# Patient Record
Sex: Female | Born: 1935 | Race: White | Hispanic: No | Smoking: Never smoker
Health system: Southern US, Community
[De-identification: ages and names within clinical notes are randomized; demographics above are authoritative.]

## PROBLEM LIST (undated history)

## (undated) DIAGNOSIS — C801 Malignant (primary) neoplasm, unspecified: Secondary | ICD-10-CM

## (undated) DIAGNOSIS — F329 Major depressive disorder, single episode, unspecified: Secondary | ICD-10-CM

## (undated) DIAGNOSIS — I6990 Unspecified sequelae of unspecified cerebrovascular disease: Secondary | ICD-10-CM

## (undated) DIAGNOSIS — E119 Type 2 diabetes mellitus without complications: Secondary | ICD-10-CM

## (undated) DIAGNOSIS — I639 Cerebral infarction, unspecified: Secondary | ICD-10-CM

## (undated) DIAGNOSIS — M159 Polyosteoarthritis, unspecified: Secondary | ICD-10-CM

## (undated) DIAGNOSIS — D649 Anemia, unspecified: Secondary | ICD-10-CM

## (undated) DIAGNOSIS — F32A Depression, unspecified: Secondary | ICD-10-CM

## (undated) DIAGNOSIS — I10 Essential (primary) hypertension: Secondary | ICD-10-CM

## (undated) DIAGNOSIS — J449 Chronic obstructive pulmonary disease, unspecified: Secondary | ICD-10-CM

## (undated) DIAGNOSIS — D3A00 Benign carcinoid tumor of unspecified site: Secondary | ICD-10-CM

## (undated) DIAGNOSIS — E785 Hyperlipidemia, unspecified: Secondary | ICD-10-CM

## (undated) HISTORY — DX: Anemia, unspecified: D64.9

## (undated) HISTORY — PX: HEMORRHOID SURGERY: SHX153

## (undated) HISTORY — DX: Depression, unspecified: F32.A

## (undated) HISTORY — DX: Polyosteoarthritis, unspecified: M15.9

## (undated) HISTORY — DX: Benign carcinoid tumor of unspecified site: D3A.00

## (undated) HISTORY — DX: Major depressive disorder, single episode, unspecified: F32.9

## (undated) HISTORY — DX: Essential (primary) hypertension: I10

## (undated) HISTORY — PX: TONSILLECTOMY: SUR1361

## (undated) HISTORY — DX: Unspecified sequelae of unspecified cerebrovascular disease: I69.90

## (undated) HISTORY — DX: Type 2 diabetes mellitus without complications: E11.9

## (undated) HISTORY — DX: Hyperlipidemia, unspecified: E78.5

---

## 2003-03-20 DIAGNOSIS — D3A00 Benign carcinoid tumor of unspecified site: Secondary | ICD-10-CM

## 2003-03-20 HISTORY — PX: COLON RESECTION: SHX5231

## 2003-03-20 HISTORY — DX: Benign carcinoid tumor of unspecified site: D3A.00

## 2011-11-02 ENCOUNTER — Encounter: Payer: Self-pay | Admitting: Internal Medicine

## 2011-11-02 ENCOUNTER — Ambulatory Visit (INDEPENDENT_AMBULATORY_CARE_PROVIDER_SITE_OTHER): Payer: Medicare Other | Admitting: Internal Medicine

## 2011-11-02 VITALS — BP 142/60 | HR 51 | Temp 98.3°F | Ht 65.0 in | Wt 107.0 lb

## 2011-11-02 DIAGNOSIS — I152 Hypertension secondary to endocrine disorders: Secondary | ICD-10-CM | POA: Insufficient documentation

## 2011-11-02 DIAGNOSIS — F32A Depression, unspecified: Secondary | ICD-10-CM

## 2011-11-02 DIAGNOSIS — D3A00 Benign carcinoid tumor of unspecified site: Secondary | ICD-10-CM

## 2011-11-02 DIAGNOSIS — I10 Essential (primary) hypertension: Secondary | ICD-10-CM

## 2011-11-02 DIAGNOSIS — I6990 Unspecified sequelae of unspecified cerebrovascular disease: Secondary | ICD-10-CM | POA: Insufficient documentation

## 2011-11-02 DIAGNOSIS — F329 Major depressive disorder, single episode, unspecified: Secondary | ICD-10-CM

## 2011-11-02 DIAGNOSIS — I6930 Unspecified sequelae of cerebral infarction: Secondary | ICD-10-CM

## 2011-11-02 DIAGNOSIS — F39 Unspecified mood [affective] disorder: Secondary | ICD-10-CM | POA: Insufficient documentation

## 2011-11-02 DIAGNOSIS — D649 Anemia, unspecified: Secondary | ICD-10-CM | POA: Insufficient documentation

## 2011-11-02 DIAGNOSIS — M159 Polyosteoarthritis, unspecified: Secondary | ICD-10-CM | POA: Insufficient documentation

## 2011-11-02 DIAGNOSIS — E785 Hyperlipidemia, unspecified: Secondary | ICD-10-CM

## 2011-11-02 LAB — CBC WITH DIFFERENTIAL/PLATELET
Basophils Relative: 0.6 % (ref 0.0–3.0)
Eosinophils Absolute: 0.1 10*3/uL (ref 0.0–0.7)
Eosinophils Relative: 1.4 % (ref 0.0–5.0)
HCT: 36.8 % (ref 36.0–46.0)
Hemoglobin: 11.9 g/dL — ABNORMAL LOW (ref 12.0–15.0)
Lymphs Abs: 1.9 10*3/uL (ref 0.7–4.0)
MCHC: 32.4 g/dL (ref 30.0–36.0)
MCV: 91.9 fl (ref 78.0–100.0)
Monocytes Absolute: 0.5 10*3/uL (ref 0.1–1.0)
Neutro Abs: 6 10*3/uL (ref 1.4–7.7)
RBC: 4.01 Mil/uL (ref 3.87–5.11)
WBC: 8.6 10*3/uL (ref 4.5–10.5)

## 2011-11-02 LAB — HEPATIC FUNCTION PANEL
Alkaline Phosphatase: 61 U/L (ref 39–117)
Bilirubin, Direct: 0 mg/dL (ref 0.0–0.3)
Total Bilirubin: 0.4 mg/dL (ref 0.3–1.2)
Total Protein: 7.4 g/dL (ref 6.0–8.3)

## 2011-11-02 LAB — LIPID PANEL
LDL Cholesterol: 51 mg/dL (ref 0–99)
Total CHOL/HDL Ratio: 2
Triglycerides: 142 mg/dL (ref 0.0–149.0)

## 2011-11-02 LAB — BASIC METABOLIC PANEL
CO2: 32 mEq/L (ref 19–32)
Chloride: 101 mEq/L (ref 96–112)
Creatinine, Ser: 0.5 mg/dL (ref 0.4–1.2)
Potassium: 3.8 mEq/L (ref 3.5–5.1)

## 2011-11-02 NOTE — Assessment & Plan Note (Signed)
BP Readings from Last 3 Encounters:  11/02/11 142/60   Has been labile but okay now No changes

## 2011-11-02 NOTE — Assessment & Plan Note (Signed)
Rx appropriate due to stroke Doesn't appear that cognitive problems are related to statin

## 2011-11-02 NOTE — Patient Instructions (Signed)
Please get last 3 years records from Dr Romeo Apple

## 2011-11-02 NOTE — Progress Notes (Signed)
Subjective:    Patient ID: Sophia Hill, female    DOB: 1921-05-31, 78 y.o.   MRN: [id number]  HPI Here with son (Marie's husband) and daughter Linton Ham here recently from [place] and is now at [facility]  Has HTN which is long standing She is satisfied with the Rx in general Does have labile BP--they generally accept systolics up to 160-170 and occ spikes higher  High cholesterol for some time On statin for some time No apparent difficulty Had a stroke 3 years ago or so---still can't write normally. Mild mobility problems. Speech is better but not back to her baseline. Walks with rollator  Arthritis in hands Some nodules and deformities but no sig pain  Had depression in the past--after stroke Rx with paroxetine Seems to do better when on this med---BP less labile  Carcinoid tumor found in 2005 Initially in intestine and  removed Then recurred  No symptoms on her monthly regimen Sees Dr Enid Skeens for this sandostatin Rx Has had elevated sugars with the carcinoid  Hiatal hernia but no symptoms from this  Current Outpatient Prescriptions on File Prior to Visit  Medication Sig Dispense Refill  . cloNIDine (CATAPRES) 0.1 MG tablet Take 0.1 mg by mouth 3 (three) times daily.       Marland Kitchen lisinopril (PRINIVIL,ZESTRIL) 10 MG tablet Take 10 mg by mouth 2 (two) times daily.       Marland Kitchen PARoxetine (PAXIL) 10 MG tablet Take 10 mg by mouth every morning.       . simvastatin (ZOCOR) 20 MG tablet Take 20 mg by mouth at bedtime.         Allergies  Allergen Reactions  . Augmentin (Amoxicillin-Pot Clavulanate) Rash    Past Medical History  Diagnosis Date  . Hyperlipidemia   . Hypertension   . Carcinoid tumor 2005    [facility]  . Depression   . Late effects of CVA (cerebrovascular accident)   . Osteoarthritis, multiple sites   . Anemia     iron deficiency in the past    Past Surgical History  Procedure Date  . Tonsillectomy   . Colon  resection 2005    for carcinoid  . Hemorrhoid surgery     Family History  Problem Relation Age of Onset  . Stroke Mother   . Hypertension Mother   . Cancer Father   . Heart disease Father   . Diabetes Brother     History   Social History  . Marital Status: Widowed    Spouse Name: N/A    Number of Children: 2  . Years of Education: N/A   Occupational History  . Retired-- [employer]    Social History Main Topics  . Smoking status: Never Smoker   . Smokeless tobacco: Never Used  . Alcohol Use: No  . Drug Use: No  . Sexually Active: Not on file   Other Topics Concern  . Not on file   Social History Narrative   Has living willSon is health care POARequests DNR--done 8/16/13No tube feeds if cognitively unaware   Review of Systems  Constitutional: Positive for unexpected weight change. Negative for fatigue.       Slow steady weight decline  HENT: Negative for hearing loss, congestion, rhinorrhea and dental problem.        Teeth are okay   Eyes:       Cataracts and some vision loss Surgery canceled due to high BP  Respiratory: Negative for cough, chest  tightness and shortness of breath.   Cardiovascular: Negative for chest pain, palpitations and leg swelling.  Gastrointestinal: Negative for constipation and blood in stool.       No heartburn pain  Genitourinary: Negative for dysuria.       Occ urge and stress incontinence  Skin: Positive for rash.       Rash on feet Not really itchy but can be painful. Better with lotion  Neurological: Positive for weakness. Negative for dizziness, syncope, light-headedness and headaches.       Right leg weakness remains from stroke---PT from [facility] has helped Did have recent fall in shower  Hematological: Negative for adenopathy. Bruises/bleeds easily.  Psychiatric/Behavioral: Negative for disturbed wake/sleep cycle and dysphoric mood. The patient is not nervous/anxious.        More forgetful than in the  past--since stroke       Objective:   Physical Exam  Constitutional: She is oriented to person, place, and time. She appears well-developed and well-nourished. No distress.  HENT:  Mouth/Throat: Oropharynx is clear and moist. No oropharyngeal exudate.  Eyes: Conjunctivae and EOM are normal. Pupils are equal, round, and reactive to light.  Neck: Normal range of motion. No thyromegaly present.  Cardiovascular: Normal rate, regular rhythm and normal heart sounds.  Exam reveals no gallop.   No murmur heard.      Faint distal pulses  Pulmonary/Chest: Effort normal and breath sounds normal. No respiratory distress. She has no wheezes. She has no rales.  Abdominal: Soft. There is no tenderness.  Musculoskeletal: She exhibits no edema and no tenderness.  Lymphadenopathy:    She has no cervical adenopathy.  Neurological: She is alert and oriented to person, place, and time.  Skin: No rash noted. No erythema.       Small contusions on right low back and side ?very early stasis changes on feet  Psychiatric: She has a normal mood and affect. Her behavior is normal.          Assessment & Plan:

## 2011-11-02 NOTE — Assessment & Plan Note (Signed)
Mood issues since the stroke Seems to do better on the med so will continue

## 2011-11-02 NOTE — Assessment & Plan Note (Signed)
Sees Dr Penny Pia--- fax [phone] On regular sandostatin

## 2011-11-02 NOTE — Assessment & Plan Note (Signed)
Mild right leg weakness Small cognitive changes On statin and ASA

## 2012-01-02 DIAGNOSIS — C19 Malignant neoplasm of rectosigmoid junction: Secondary | ICD-10-CM

## 2012-01-02 DIAGNOSIS — I6789 Other cerebrovascular disease: Secondary | ICD-10-CM

## 2012-01-02 DIAGNOSIS — I10 Essential (primary) hypertension: Secondary | ICD-10-CM

## 2012-01-04 ENCOUNTER — Encounter: Payer: Self-pay | Admitting: Internal Medicine

## 2012-01-04 ENCOUNTER — Ambulatory Visit (INDEPENDENT_AMBULATORY_CARE_PROVIDER_SITE_OTHER): Payer: Medicare Other | Admitting: Internal Medicine

## 2012-01-04 VITALS — BP 158/60 | HR 58 | Temp 98.1°F | Wt 111.0 lb

## 2012-01-04 DIAGNOSIS — D3A00 Benign carcinoid tumor of unspecified site: Secondary | ICD-10-CM

## 2012-01-04 DIAGNOSIS — F329 Major depressive disorder, single episode, unspecified: Secondary | ICD-10-CM

## 2012-01-04 DIAGNOSIS — Z23 Encounter for immunization: Secondary | ICD-10-CM

## 2012-01-04 DIAGNOSIS — E785 Hyperlipidemia, unspecified: Secondary | ICD-10-CM

## 2012-01-04 DIAGNOSIS — I6990 Unspecified sequelae of unspecified cerebrovascular disease: Secondary | ICD-10-CM

## 2012-01-04 DIAGNOSIS — I10 Essential (primary) hypertension: Secondary | ICD-10-CM

## 2012-01-04 MED ORDER — LISINOPRIL-HYDROCHLOROTHIAZIDE 20-25 MG PO TABS: 1.0000 | ORAL_TABLET | Freq: Every day | ORAL | Status: DC

## 2012-01-04 NOTE — Assessment & Plan Note (Signed)
BP Readings from Last 3 Encounters:  01/04/12 158/60  11/02/11 142/60   Up some Will stop the clonidine in case it is causing sedation Add HCTZ

## 2012-01-04 NOTE — Assessment & Plan Note (Signed)
Not sure risks are worth the benefit despite her history of stroke Will stop the statin

## 2012-01-04 NOTE — Assessment & Plan Note (Signed)
Not evident Will try to wean off the paroxetine

## 2012-01-04 NOTE — Patient Instructions (Addendum)
Please stop the clonidine and the simvastatin (cholesterol med) Start the lisinopril/HCTZ ----stop the plain lisinopril Decrease the paroxetine to 1 tab (10mg ) every other day

## 2012-01-04 NOTE — Addendum Note (Signed)
Addended by: Sueanne Margarita on: 01/04/2012 03:31 PM   Modules accepted: Orders

## 2012-01-04 NOTE — Assessment & Plan Note (Signed)
has had decline in functional status More dependent Now needs daily ADL assist

## 2012-01-04 NOTE — Assessment & Plan Note (Signed)
Has done well with sandostatin for this Had been quite ill before treatment

## 2012-01-04 NOTE — Progress Notes (Signed)
Subjective:    Patient ID: Sophia Hill, female    DOB: [birth year], 78 y.o.   MRN: [id number]  HPI Here with son  Had 2 falls at the [facility] trying to get to bathroom Status has declined Ongoing memory problems Needs help with dressing and bathing.  Independent with bathroom but has had occ incontinence (she relates to too much fruit in diet there) Walks with rollator walker---gets to dining room without assistance. Has had more trouble walking lately Seems to be sleeping more Does participate in [facility]  BP has been high Son didn't realize lisinopril dose was so high Is sedated--discussed that this could be the clonidine  Not depressed Wonders about the paroxetine causing some of her symptoms  Still on tumor rx---sandostatin  Current Outpatient Prescriptions on File Prior to Visit  Medication Sig Dispense Refill  . aspirin 81 MG tablet Take 81 mg by mouth daily.      . Cholecalciferol (VITAMIN D) 2000 UNITS CAPS Take by mouth daily.      . cloNIDine (CATAPRES) 0.1 MG tablet Take 0.1 mg by mouth 3 (three) times daily.       Marland Kitchen docusate sodium (STOOL SOFTENER) 100 MG capsule Take 100 mg by mouth as needed.      Marland Kitchen lisinopril (PRINIVIL,ZESTRIL) 10 MG tablet Take 10 mg by mouth 2 (two) times daily.       . Multiple Vitamins-Minerals (CENTRUM SILVER ADULT 50+) TABS Take by mouth daily.      Marland Kitchen PARoxetine (PAXIL) 10 MG tablet Take 10 mg by mouth every morning.       . simvastatin (ZOCOR) 20 MG tablet Take 20 mg by mouth at bedtime.         Allergies  Allergen Reactions  . Augmentin (Amoxicillin-Pot Clavulanate) Rash    Past Medical History  Diagnosis Date  . Hyperlipidemia   . Hypertension   . Carcinoid tumor 2005    [facility]  . Depression   . Late effects of CVA (cerebrovascular accident)   . Osteoarthritis, multiple sites   . Anemia     iron deficiency in the past    Past Surgical History  Procedure Date    . Tonsillectomy   . Colon resection 2005    for carcinoid  . Hemorrhoid surgery     Family History  Problem Relation Age of Onset  . Stroke Mother   . Hypertension Mother   . Cancer Father   . Heart disease Father   . Diabetes Brother     History   Social History  . Marital Status: Widowed    Spouse Name: N/A    Number of Children: 2  . Years of Education: N/A   Occupational History  . Retired-- [employer]    Social History Main Topics  . Smoking status: Never Smoker   . Smokeless tobacco: Never Used  . Alcohol Use: No  . Drug Use: No  . Sexually Active: Not on file   Other Topics Concern  . Not on file   Social History Narrative   Has living willSon is health care POARequests DNR--done 8/16/13No tube feeds if cognitively unaware   Review of Systems Appetite is good Bothered that she can't gain any weight---is up from low point of 104# No diarrhea, flushing    Objective:   Physical Exam  Constitutional: She appears well-developed and well-nourished. No distress.  Neck: Normal range of motion. Neck supple. No thyromegaly present.  Cardiovascular: Normal rate and regular rhythm.  Exam reveals no gallop.   Murmur heard.      Gr 2/6 systolic murmur  Pulmonary/Chest: Effort normal and breath sounds normal. No respiratory distress. She has no wheezes. She has no rales.  Abdominal: Soft. There is no tenderness.  Musculoskeletal: She exhibits no edema and no tenderness.       Slow transfer out of chair Needed assist to get on table  Lymphadenopathy:    She has no cervical adenopathy.  Neurological: She is alert.       Friday, October, 1913 Medical office [place], [place] President-- "Obama, ?"  Psychiatric: She has a normal mood and affect. Her behavior is normal.          Assessment & Plan:

## 2012-01-10 ENCOUNTER — Telehealth: Payer: Self-pay | Admitting: Internal Medicine

## 2012-01-10 NOTE — Telephone Encounter (Signed)
Phone call from [facility] nurse BP going way up since off the clonidine 210/100 or more Feels bad, diaphoresis, etc  Will restart clonidine --but as patch. Less chance of CNS side effects

## 2012-01-11 DIAGNOSIS — D3A00 Benign carcinoid tumor of unspecified site: Secondary | ICD-10-CM

## 2012-01-11 DIAGNOSIS — I6789 Other cerebrovascular disease: Secondary | ICD-10-CM

## 2012-01-16 ENCOUNTER — Ambulatory Visit: Payer: Self-pay | Admitting: Oncology

## 2012-01-17 ENCOUNTER — Ambulatory Visit: Payer: Self-pay | Admitting: Oncology

## 2012-01-17 LAB — COMPREHENSIVE METABOLIC PANEL
Albumin: 3.2 g/dL — ABNORMAL LOW (ref 3.4–5.0)
Anion Gap: 6 — ABNORMAL LOW (ref 7–16)
Calcium, Total: 9.2 mg/dL (ref 8.5–10.1)
Chloride: 99 mmol/L (ref 98–107)
EGFR (African American): >60
EGFR (Non-African Amer.): >60
Glucose: 234 mg/dL — ABNORMAL HIGH (ref 65–99)
Osmolality: 282 (ref 275–301)
Potassium: 4 mmol/L (ref 3.5–5.1)
SGOT(AST): 26 U/L (ref 15–37)
SGPT (ALT): 43 U/L (ref 12–78)
Sodium: 136 mmol/L (ref 136–145)

## 2012-01-17 LAB — CBC CANCER CENTER
Basophil #: 0.1 x10 3/mm (ref 0.0–0.1)
Basophil %: 0.9 %
Eosinophil %: 3.1 %
HCT: 37.9 % (ref 35.0–47.0)
Lymphocyte %: 26.6 %
MCH: 29.7 pg (ref 26.0–34.0)
MCV: 93 fL (ref 80–100)
Monocyte %: 10.9 %
Neutrophil %: 58.5 %
WBC: 6.7 x10 3/mm (ref 3.6–11.0)

## 2012-01-18 ENCOUNTER — Ambulatory Visit: Payer: Self-pay

## 2012-01-18 ENCOUNTER — Ambulatory Visit: Payer: Self-pay | Admitting: Oncology

## 2012-02-04 ENCOUNTER — Encounter: Payer: Self-pay | Admitting: Internal Medicine

## 2012-02-04 ENCOUNTER — Ambulatory Visit (INDEPENDENT_AMBULATORY_CARE_PROVIDER_SITE_OTHER): Payer: Medicare Other | Admitting: Internal Medicine

## 2012-02-04 VITALS — BP 158/60 | HR 64 | Temp 98.2°F | Wt 109.0 lb

## 2012-02-04 DIAGNOSIS — F329 Major depressive disorder, single episode, unspecified: Secondary | ICD-10-CM

## 2012-02-04 DIAGNOSIS — I10 Essential (primary) hypertension: Secondary | ICD-10-CM

## 2012-02-04 DIAGNOSIS — D3A00 Benign carcinoid tumor of unspecified site: Secondary | ICD-10-CM

## 2012-02-04 DIAGNOSIS — I6990 Unspecified sequelae of unspecified cerebrovascular disease: Secondary | ICD-10-CM

## 2012-02-04 LAB — BASIC METABOLIC PANEL
BUN: 21 mg/dL (ref 6–23)
CO2: 31 mEq/L (ref 19–32)
Calcium: 9 mg/dL (ref 8.4–10.5)
Creatinine, Ser: 0.6 mg/dL (ref 0.4–1.2)

## 2012-02-04 NOTE — Assessment & Plan Note (Signed)
Still has good response with sandostatin Dr Doylene Canning is now following

## 2012-02-04 NOTE — Assessment & Plan Note (Signed)
Some ADL dependence No decline since last visit On ASA

## 2012-02-04 NOTE — Assessment & Plan Note (Signed)
Has not been a problem Will stop the paroxetine

## 2012-02-04 NOTE — Patient Instructions (Signed)
Please stop the paroxetine It is okay to get the shingles vaccine now (zostavax) It is okay to go ahead with the cataract removal now

## 2012-02-04 NOTE — Assessment & Plan Note (Signed)
BP went very high off the clonidine so back on the patch Feels better with this---more consistent Not as tired Will check labs on the HCTZ

## 2012-02-04 NOTE — Progress Notes (Signed)
Subjective:    Patient ID: Sophia Hill, female    DOB: 1921-07-30, 78 y.o.   MRN: [id number]  HPI Here with son  Now seeing Dr Doylene Canning for her carcinoid She did get sandostatin through hospice and he will follow from now on  Stopped the clonidine but BP went up too high Now back on but in the patch Not as fatigued on this  Energy levels are better now Son thinks it is from the paroxetine Not depressed Doesn't feel anxious  Appetite is fairly good Weight down 2#  No dizziness Walking with rollator No further falls  Current Outpatient Prescriptions on File Prior to Visit  Medication Sig Dispense Refill  . aspirin 81 MG tablet Take 81 mg by mouth daily.      . Cholecalciferol (VITAMIN D) 2000 UNITS CAPS Take by mouth daily.      . cloNIDine (CATAPRES - DOSED IN MG/24 HR) 0.1 mg/24hr patch Place 1 patch onto the skin once a week.      . docusate sodium (STOOL SOFTENER) 100 MG capsule Take 100 mg by mouth as needed.      Marland Kitchen lisinopril-hydrochlorothiazide (PRINZIDE,ZESTORETIC) 20-25 MG per tablet Take 1 tablet by mouth daily.  30 tablet  11  . Multiple Vitamins-Minerals (CENTRUM SILVER ADULT 50+) TABS Take by mouth daily.      Marland Kitchen PARoxetine (PAXIL) 10 MG tablet Take 10 mg by mouth every other day.         Allergies  Allergen Reactions  . Augmentin (Amoxicillin-Pot Clavulanate) Rash    Past Medical History  Diagnosis Date  . Hyperlipidemia   . Hypertension   . Carcinoid tumor 2005    [facility]  . Depression   . Late effects of CVA (cerebrovascular accident)   . Osteoarthritis, multiple sites   . Anemia     iron deficiency in the past    Past Surgical History  Procedure Date  . Tonsillectomy   . Colon resection 2005    for carcinoid  . Hemorrhoid surgery     Family History  Problem Relation Age of Onset  . Stroke Mother   . Hypertension Mother   . Cancer Father   . Heart disease Father   . Diabetes Brother     History    Social History  . Marital Status: Widowed    Spouse Name: N/A    Number of Children: 2  . Years of Education: N/A   Occupational History  . Retired-- [employer]    Social History Main Topics  . Smoking status: Never Smoker   . Smokeless tobacco: Never Used  . Alcohol Use: No  . Drug Use: No  . Sexually Active: Not on file   Other Topics Concern  . Not on file   Social History Narrative   Has living willSon is health care POARequests DNR--done 8/16/13No tube feeds if cognitively unaware   Review of Systems Sleeps well No diarrhea No flushing     Objective:   Physical Exam  Constitutional: She appears well-developed. No distress.  Neck: Normal range of motion. Neck supple. No thyromegaly present.  Cardiovascular: Normal rate, regular rhythm and normal heart sounds.  Exam reveals no gallop.   No murmur heard. Pulmonary/Chest: Effort normal and breath sounds normal. No respiratory distress. She has no wheezes. She has no rales.  Abdominal: Soft. There is no tenderness.  Musculoskeletal: She exhibits no edema and no tenderness.  Lymphadenopathy:    She has  no cervical adenopathy.  Psychiatric: She has a normal mood and affect. Her behavior is normal. Thought content normal.          Assessment & Plan:

## 2012-02-05 ENCOUNTER — Encounter: Payer: Self-pay | Admitting: *Deleted

## 2012-02-07 ENCOUNTER — Other Ambulatory Visit: Payer: Self-pay | Admitting: *Deleted

## 2012-02-07 NOTE — Telephone Encounter (Signed)
Pharmacy notified.

## 2012-02-07 NOTE — Telephone Encounter (Signed)
Received a refill request for clonidine, per recent ov clonidine was d/c. Do you want to refill it?

## 2012-02-07 NOTE — Telephone Encounter (Signed)
No  Let pharmacy know that it has been discontinued, for now at least

## 2012-02-08 MED ORDER — CLONIDINE HCL 0.1 MG/24HR TD PTWK: 1.0000 | MEDICATED_PATCH | TRANSDERMAL | Status: DC

## 2012-02-08 NOTE — Telephone Encounter (Signed)
Refill the patch for a year

## 2012-02-08 NOTE — Addendum Note (Signed)
Addended by: Sueanne Margarita on: 02/08/2012 04:58 PM   Modules accepted: Orders

## 2012-02-08 NOTE — Telephone Encounter (Signed)
Called and left message for hospice nurse and also for the triage nurse at hospice

## 2012-02-08 NOTE — Telephone Encounter (Signed)
Marnie with [facility] said from 02/04/12 note pt was to continue clonidine patch; BP was elevated off clonidine. Marnie requested refill and denied at pharmacy. Please clarify. [facility]. [facility] request call back.

## 2012-04-23 ENCOUNTER — Ambulatory Visit: Payer: Self-pay | Admitting: Oncology

## 2012-04-24 LAB — COMPREHENSIVE METABOLIC PANEL
Alkaline Phosphatase: 73 U/L (ref 50–136)
Anion Gap: 2 — ABNORMAL LOW (ref 7–16)
Chloride: 101 mmol/L (ref 98–107)
Co2: 35 mmol/L — ABNORMAL HIGH (ref 21–32)
Creatinine: 0.86 mg/dL (ref 0.60–1.30)
EGFR (African American): >60
Glucose: 180 mg/dL — ABNORMAL HIGH (ref 65–99)
Potassium: 4.2 mmol/L (ref 3.5–5.1)
SGOT(AST): 26 U/L (ref 15–37)
SGPT (ALT): 46 U/L (ref 12–78)
Sodium: 138 mmol/L (ref 136–145)

## 2012-04-24 LAB — CBC CANCER CENTER
Basophil #: 0 x10 3/mm (ref 0.0–0.1)
Basophil %: 0.6 %
Eosinophil #: 0.1 x10 3/mm (ref 0.0–0.7)
Eosinophil %: 1.7 %
HCT: 37.2 % (ref 35.0–47.0)
Lymphocyte %: 23.3 %
MCH: 30 pg (ref 26.0–34.0)
MCV: 90 fL (ref 80–100)
Monocyte #: 0.5 x10 3/mm (ref 0.2–0.9)
Monocyte %: 7.5 %
Neutrophil #: 4.6 x10 3/mm (ref 1.4–6.5)
Platelet: 202 x10 3/mm (ref 150–440)
RBC: 4.11 10*6/uL (ref 3.80–5.20)
WBC: 6.8 x10 3/mm (ref 3.6–11.0)

## 2012-05-05 ENCOUNTER — Ambulatory Visit (INDEPENDENT_AMBULATORY_CARE_PROVIDER_SITE_OTHER): Payer: Medicare Other | Admitting: Internal Medicine

## 2012-05-05 ENCOUNTER — Encounter: Payer: Self-pay | Admitting: Internal Medicine

## 2012-05-05 VITALS — BP 160/60 | HR 61 | Temp 97.7°F | Wt 111.0 lb

## 2012-05-05 DIAGNOSIS — D3A00 Benign carcinoid tumor of unspecified site: Secondary | ICD-10-CM

## 2012-05-05 DIAGNOSIS — I6990 Unspecified sequelae of unspecified cerebrovascular disease: Secondary | ICD-10-CM

## 2012-05-05 DIAGNOSIS — I10 Essential (primary) hypertension: Secondary | ICD-10-CM

## 2012-05-05 DIAGNOSIS — M159 Polyosteoarthritis, unspecified: Secondary | ICD-10-CM

## 2012-05-05 NOTE — Progress Notes (Signed)
Subjective:    Patient ID: Sophia Hill, female    DOB: 04/13/1921, 79 y.o.   MRN: [id number]  HPI Here with son  Getting sandostatin every 6 weeks from Dr Doylene Canning This seems to be controlling symptoms No sig diarrhea---only occ soft stools (does have occ "blow out"----brief period of diarrhea that is not prolonged)  Gets help with showers Needs meds administered Independent with dressing, bathroom Hospice helps with making up bed, etc Has done much better since hospice has been helping her  No weakness, dysphagia, aphasia, facial droop  No chest pain No SOB No dizziness or syncope Sedation gone off the oral clonidine  No sig arthritis pain Mood has been good  Current Outpatient Prescriptions on File Prior to Visit  Medication Sig Dispense Refill  . aspirin 81 MG tablet Take 81 mg by mouth daily.      . Cholecalciferol (VITAMIN D) 2000 UNITS CAPS Take by mouth daily.      . cloNIDine (CATAPRES - DOSED IN MG/24 HR) 0.1 mg/24hr patch Place 1 patch (0.1 mg total) onto the skin once a week.  4 patch  11  . docusate sodium (STOOL SOFTENER) 100 MG capsule Take 100 mg by mouth as needed.      Marland Kitchen lisinopril-hydrochlorothiazide (PRINZIDE,ZESTORETIC) 20-25 MG per tablet Take 1 tablet by mouth daily.  30 tablet  11  . Multiple Vitamins-Minerals (CENTRUM SILVER ADULT 50+) TABS Take by mouth daily.       No current facility-administered medications on file prior to visit.    Allergies  Allergen Reactions  . Augmentin (Amoxicillin-Pot Clavulanate) Rash    Past Medical History  Diagnosis Date  . Hyperlipidemia   . Hypertension   . Carcinoid tumor 2005    [facility]  . Depression   . Late effects of CVA (cerebrovascular accident)   . Osteoarthritis, multiple sites   . Anemia     iron deficiency in the past    Past Surgical History  Procedure Laterality Date  . Tonsillectomy    . Colon resection  2005    for carcinoid  . Hemorrhoid  surgery      Family History  Problem Relation Age of Onset  . Stroke Mother   . Hypertension Mother   . Cancer Father   . Heart disease Father   . Diabetes Brother     History   Social History  . Marital Status: Widowed    Spouse Name: N/A    Number of Children: 2  . Years of Education: N/A   Occupational History  . Retired-- [employer]    Social History Main Topics  . Smoking status: Never Smoker   . Smokeless tobacco: Never Used  . Alcohol Use: No  . Drug Use: No  . Sexually Active: Not on file   Other Topics Concern  . Not on file   Social History Narrative   Has living will   Son is health care POA   Requests DNR--done 11/02/11   No tube feeds if cognitively unaware   Review of Systems Appetite is fine Sleeps well Weight stable Does have some dripping in nose    Objective:   Physical Exam  Constitutional: She appears well-developed and well-nourished. No distress.  Neck: Normal range of motion. Neck supple. No thyromegaly present.  Cardiovascular: Normal rate and regular rhythm.  Exam reveals no gallop.   Murmur heard. Grade 2/6 systolic murmur  Pulmonary/Chest: Effort normal and breath sounds normal.  No respiratory distress. She has no wheezes. She has no rales.  Abdominal: Soft. There is no tenderness.  Musculoskeletal: She exhibits no edema and no tenderness.  Lymphadenopathy:    She has no cervical adenopathy.  Psychiatric: She has a normal mood and affect. Her behavior is normal.          Assessment & Plan:

## 2012-05-05 NOTE — Assessment & Plan Note (Signed)
Symptoms controlled on the sandostatin Rx per Dr Doylene Canning Still on hospice

## 2012-05-05 NOTE — Assessment & Plan Note (Signed)
No sig pain lately

## 2012-05-05 NOTE — Assessment & Plan Note (Signed)
No major problems Needs a little help and med admin. On ASA

## 2012-05-05 NOTE — Assessment & Plan Note (Signed)
BP Readings from Last 3 Encounters:  05/05/12 160/60  02/04/12 158/60  01/04/12 158/60   Reasonable control for her age and having carcinoid Tolerating meds No change

## 2012-05-17 ENCOUNTER — Ambulatory Visit: Payer: Self-pay | Admitting: Oncology

## 2012-07-15 ENCOUNTER — Ambulatory Visit: Payer: Self-pay | Admitting: Ophthalmology

## 2012-07-28 ENCOUNTER — Ambulatory Visit: Payer: Self-pay | Admitting: Ophthalmology

## 2012-08-14 ENCOUNTER — Other Ambulatory Visit: Payer: Self-pay | Admitting: *Deleted

## 2012-08-14 MED ORDER — CLONIDINE HCL 0.1 MG/24HR TD PTWK: 1.0000 | MEDICATED_PATCH | TRANSDERMAL | Status: DC

## 2012-08-14 NOTE — Telephone Encounter (Signed)
Okay to fill for a year 

## 2012-08-14 NOTE — Telephone Encounter (Signed)
rx sent to pharmacy by e-script  

## 2012-09-08 ENCOUNTER — Ambulatory Visit: Payer: Medicare Other | Admitting: Internal Medicine

## 2012-09-24 ENCOUNTER — Ambulatory Visit: Payer: Self-pay | Admitting: Oncology

## 2012-09-29 LAB — CBC CANCER CENTER
Basophil #: 0.1 x10 3/mm (ref 0.0–0.1)
Basophil %: 0.7 %
Eosinophil #: 0.1 x10 3/mm (ref 0.0–0.7)
Eosinophil %: 1.7 %
HGB: 11.4 g/dL — ABNORMAL LOW (ref 12.0–16.0)
MCH: 30.9 pg (ref 26.0–34.0)
MCV: 90 fL (ref 80–100)
Monocyte #: 0.6 x10 3/mm (ref 0.2–0.9)
Monocyte %: 7.9 %
Neutrophil %: 64.9 %
Platelet: 238 x10 3/mm (ref 150–440)
RBC: 3.7 10*6/uL — ABNORMAL LOW (ref 3.80–5.20)
RDW: 14.1 % (ref 11.5–14.5)

## 2012-09-29 LAB — COMPREHENSIVE METABOLIC PANEL
Alkaline Phosphatase: 72 U/L (ref 50–136)
Anion Gap: 4 — ABNORMAL LOW (ref 7–16)
BUN: 27 mg/dL — ABNORMAL HIGH (ref 7–18)
Bilirubin,Total: 0.3 mg/dL (ref 0.2–1.0)
Calcium, Total: 9.1 mg/dL (ref 8.5–10.1)
EGFR (African American): >60
EGFR (Non-African Amer.): 58 — ABNORMAL LOW
Potassium: 4 mmol/L (ref 3.5–5.1)
SGOT(AST): 18 U/L (ref 15–37)
SGPT (ALT): 29 U/L (ref 12–78)
Total Protein: 7.4 g/dL (ref 6.4–8.2)

## 2012-10-17 ENCOUNTER — Ambulatory Visit: Payer: Self-pay | Admitting: Oncology

## 2012-10-27 ENCOUNTER — Encounter: Payer: Self-pay | Admitting: Internal Medicine

## 2012-10-27 ENCOUNTER — Ambulatory Visit (INDEPENDENT_AMBULATORY_CARE_PROVIDER_SITE_OTHER): Payer: Medicare Other | Admitting: Internal Medicine

## 2012-10-27 VITALS — BP 150/60 | HR 64 | Temp 98.0°F | Wt 119.0 lb

## 2012-10-27 DIAGNOSIS — D3A00 Benign carcinoid tumor of unspecified site: Secondary | ICD-10-CM

## 2012-10-27 DIAGNOSIS — I10 Essential (primary) hypertension: Secondary | ICD-10-CM

## 2012-10-27 DIAGNOSIS — I6990 Unspecified sequelae of unspecified cerebrovascular disease: Secondary | ICD-10-CM

## 2012-10-27 DIAGNOSIS — R7301 Impaired fasting glucose: Secondary | ICD-10-CM

## 2012-10-27 NOTE — Progress Notes (Signed)
Subjective:    Patient ID: Sophia Hill, female    DOB: 06/14/1921, 79 y.o.   MRN: [id number]  HPI Here with son  Still at [facility] in independent part Off hospice and followed by [facility]  Continues to follow with Dr Doylene Canning Gets sandostatin monthly No diarrhea or flushing  Gets help with showering Son sets up her meds Independent with ADLs otherwise Meals provided at [facility]  No chest pain No palpitations No SOB Better energy since being on the clonidine patch instead of the pills No sedation  Mood has been good Not anhedonic  Current Outpatient Prescriptions on File Prior to Visit  Medication Sig Dispense Refill  . aspirin 81 MG tablet Take 81 mg by mouth daily.      . Cholecalciferol (VITAMIN D) 2000 UNITS CAPS Take by mouth daily.      . cloNIDine (CATAPRES - DOSED IN MG/24 HR) 0.1 mg/24hr patch Place 1 patch (0.1 mg total) onto the skin once a week.  4 patch  11  . docusate sodium (STOOL SOFTENER) 100 MG capsule Take 100 mg by mouth as needed.      Marland Kitchen lisinopril-hydrochlorothiazide (PRINZIDE,ZESTORETIC) 20-25 MG per tablet Take 1 tablet by mouth daily.  30 tablet  11  . Multiple Vitamins-Minerals (CENTRUM SILVER ADULT 50+) TABS Take by mouth daily.       No current facility-administered medications on file prior to visit.    Allergies  Allergen Reactions  . Augmentin (Amoxicillin-Pot Clavulanate) Rash    Past Medical History  Diagnosis Date  . Hyperlipidemia   . Hypertension   . Carcinoid tumor 2005    [facility]  . Depression   . Late effects of CVA (cerebrovascular accident)   . Osteoarthritis, multiple sites   . Anemia     iron deficiency in the past    Past Surgical History  Procedure Laterality Date  . Tonsillectomy    . Colon resection  2005    for carcinoid  . Hemorrhoid surgery      Family History  Problem Relation Age of Onset  . Stroke Mother   . Hypertension Mother   . Cancer Father    . Heart disease Father   . Diabetes Brother     History   Social History  . Marital Status: Widowed    Spouse Name: N/A    Number of Children: 2  . Years of Education: N/A   Occupational History  . Retired-- [employer]    Social History Main Topics  . Smoking status: Never Smoker   . Smokeless tobacco: Never Used  . Alcohol Use: No  . Drug Use: No  . Sexually Active: Not on file   Other Topics Concern  . Not on file   Social History Narrative   Has living will   Son is health care POA   Requests DNR--done 11/02/11   No tube feeds if cognitively unaware   Review of Systems Concerned about elevated sugar---DM in family Gets tired easily-- but tries to walk outside on daily basis Sleeps well    Objective:   Physical Exam  Constitutional: She appears well-developed and well-nourished. No distress.  Neck: Normal range of motion. Neck supple. No thyromegaly present.  Cardiovascular: Normal rate and regular rhythm.  Exam reveals no gallop.   Murmur heard. Gr 3/6 mitral systolic murmur  Pulmonary/Chest: Effort normal and breath sounds normal. No respiratory distress. She has no wheezes. She has no rales.  Musculoskeletal:  She exhibits no edema and no tenderness.  Lymphadenopathy:    She has no cervical adenopathy.  Psychiatric: She has a normal mood and affect. Her behavior is normal.          Assessment & Plan:

## 2012-10-27 NOTE — Assessment & Plan Note (Signed)
In control with Rx Improved functional status Has gained weight No longer on hospice

## 2012-10-27 NOTE — Assessment & Plan Note (Signed)
BP Readings from Last 3 Encounters:  10/27/12 150/60  05/05/12 160/60  02/04/12 158/60   Good control without side effects on clonidine patch

## 2012-10-27 NOTE — Assessment & Plan Note (Signed)
Mild functional impairments Overall doing pretty well

## 2012-10-27 NOTE — Assessment & Plan Note (Signed)
Will check labs  Ate lunch about 3 hours ago

## 2012-10-28 LAB — CBC WITH DIFFERENTIAL/PLATELET
Basophils Absolute: 0.1 10*3/uL (ref 0.0–0.1)
Eosinophils Absolute: 0.1 10*3/uL (ref 0.0–0.7)
Lymphocytes Relative: 27.5 % (ref 12.0–46.0)
MCHC: 33.2 g/dL (ref 30.0–36.0)
Neutrophils Relative %: 62.5 % (ref 43.0–77.0)
Platelets: 217 10*3/uL (ref 150.0–400.0)
RBC: 3.69 Mil/uL — ABNORMAL LOW (ref 3.87–5.11)
RDW: 14.4 % (ref 11.5–14.6)

## 2012-10-28 LAB — BASIC METABOLIC PANEL
BUN: 25 mg/dL — ABNORMAL HIGH (ref 6–23)
CO2: 28 mEq/L (ref 19–32)
Calcium: 9.2 mg/dL (ref 8.4–10.5)
Creatinine, Ser: 0.9 mg/dL (ref 0.4–1.2)
Glucose, Bld: 179 mg/dL — ABNORMAL HIGH (ref 70–99)

## 2012-10-28 LAB — HEMOGLOBIN A1C: Hgb A1c MFr Bld: 7.5 % — ABNORMAL HIGH (ref 4.6–6.5)

## 2012-10-28 LAB — HEPATIC FUNCTION PANEL
Alkaline Phosphatase: 47 U/L (ref 39–117)
Bilirubin, Direct: 0 mg/dL (ref 0.0–0.3)

## 2012-10-29 ENCOUNTER — Encounter: Payer: Self-pay | Admitting: Internal Medicine

## 2012-11-11 ENCOUNTER — Encounter: Payer: Self-pay | Admitting: *Deleted

## 2012-11-11 ENCOUNTER — Other Ambulatory Visit: Payer: Self-pay | Admitting: *Deleted

## 2012-11-11 MED ORDER — LISINOPRIL-HYDROCHLOROTHIAZIDE 20-25 MG PO TABS: 1.0000 | ORAL_TABLET | Freq: Every day | ORAL | Status: DC

## 2012-11-11 MED ORDER — METFORMIN HCL ER 500 MG PO TB24: 500.0000 mg | ORAL_TABLET | Freq: Every day | ORAL | Status: DC

## 2012-11-17 ENCOUNTER — Ambulatory Visit: Payer: Self-pay | Admitting: Oncology

## 2012-11-19 ENCOUNTER — Telehealth: Payer: Self-pay | Admitting: Internal Medicine

## 2012-11-19 NOTE — Telephone Encounter (Signed)
Spoke with son and scheduled lab appt

## 2012-11-19 NOTE — Telephone Encounter (Signed)
Discussed this with DIL Has been eating more===not all good for her now that she has diabetes  Will not take the metformin due to their concerns about diarrhea Will just recheck her A1c sooner  [facility], Please call to set up HgbA1c in about 2.5 months

## 2012-11-19 NOTE — Telephone Encounter (Signed)
The patient's daughter called and is hoping to discuss lowering her metformin medication.  She is hoping you can call her back at your earliest convenience.    Name - Hilda Lias , callback - [phone]

## 2012-11-27 ENCOUNTER — Ambulatory Visit: Payer: Medicare Other | Admitting: Internal Medicine

## 2012-12-10 LAB — COMPREHENSIVE METABOLIC PANEL
Albumin: 3.3 g/dL — ABNORMAL LOW (ref 3.4–5.0)
Alkaline Phosphatase: 73 U/L (ref 50–136)
Anion Gap: 6 — ABNORMAL LOW (ref 7–16)
BUN: 18 mg/dL (ref 7–18)
Bilirubin,Total: 0.3 mg/dL (ref 0.2–1.0)
Chloride: 100 mmol/L (ref 98–107)
Co2: 31 mmol/L (ref 21–32)
Creatinine: 0.83 mg/dL (ref 0.60–1.30)
EGFR (African American): >60
Osmolality: 279 (ref 275–301)
Potassium: 3.7 mmol/L (ref 3.5–5.1)
SGOT(AST): 23 U/L (ref 15–37)
SGPT (ALT): 38 U/L (ref 12–78)
Sodium: 137 mmol/L (ref 136–145)
Total Protein: 7.3 g/dL (ref 6.4–8.2)

## 2012-12-10 LAB — CBC CANCER CENTER
Basophil %: 0.9 %
Eosinophil #: 0.2 x10 3/mm (ref 0.0–0.7)
Eosinophil %: 2.1 %
HCT: 34.4 % — ABNORMAL LOW (ref 35.0–47.0)
HGB: 11.5 g/dL — ABNORMAL LOW (ref 12.0–16.0)
Lymphocyte #: 1.8 x10 3/mm (ref 1.0–3.6)
Lymphocyte %: 23.7 %
MCHC: 33.3 g/dL (ref 32.0–36.0)
MCV: 91 fL (ref 80–100)
Monocyte #: 0.5 x10 3/mm (ref 0.2–0.9)
Monocyte %: 7 %
Neutrophil #: 5.1 x10 3/mm (ref 1.4–6.5)
Platelet: 205 x10 3/mm (ref 150–440)
RBC: 3.79 10*6/uL — ABNORMAL LOW (ref 3.80–5.20)
RDW: 14.4 % (ref 11.5–14.5)

## 2012-12-17 ENCOUNTER — Ambulatory Visit: Payer: Self-pay | Admitting: Oncology

## 2013-01-16 ENCOUNTER — Other Ambulatory Visit: Payer: Self-pay | Admitting: Internal Medicine

## 2013-01-16 DIAGNOSIS — R7301 Impaired fasting glucose: Secondary | ICD-10-CM

## 2013-01-20 ENCOUNTER — Other Ambulatory Visit (INDEPENDENT_AMBULATORY_CARE_PROVIDER_SITE_OTHER): Payer: Medicare Other

## 2013-01-20 DIAGNOSIS — R7301 Impaired fasting glucose: Secondary | ICD-10-CM

## 2013-01-22 ENCOUNTER — Emergency Department: Payer: Self-pay | Admitting: Emergency Medicine

## 2013-01-22 ENCOUNTER — Other Ambulatory Visit: Payer: Self-pay

## 2013-01-26 ENCOUNTER — Telehealth: Payer: Self-pay

## 2013-01-26 NOTE — Telephone Encounter (Signed)
Sophia Hill cannot get into mychart; cannot remember his password; pt will call [phone] for assistance; gave 01/20/13 lab results as noted in pts chart. [name] voiced understanding.

## 2013-02-04 ENCOUNTER — Ambulatory Visit: Payer: Self-pay | Admitting: Oncology

## 2013-02-16 ENCOUNTER — Ambulatory Visit: Payer: Self-pay | Admitting: Oncology

## 2013-03-03 ENCOUNTER — Other Ambulatory Visit: Payer: Self-pay | Admitting: *Deleted

## 2013-03-03 NOTE — Telephone Encounter (Signed)
Ok to fill 

## 2013-03-04 MED ORDER — CLONIDINE HCL 0.1 MG/24HR TD PTWK: 0.1000 mg | MEDICATED_PATCH | TRANSDERMAL | Status: DC

## 2013-03-04 NOTE — Telephone Encounter (Signed)
rx sent to pharmacy by e-script  

## 2013-03-04 NOTE — Telephone Encounter (Signed)
Okay to refill for a year 

## 2013-03-05 ENCOUNTER — Telehealth: Payer: Self-pay | Admitting: Internal Medicine

## 2013-03-05 ENCOUNTER — Other Ambulatory Visit: Payer: Self-pay | Admitting: *Deleted

## 2013-03-05 NOTE — Telephone Encounter (Signed)
Patient had her flu shot done in November at Dr.Choksi's office.

## 2013-03-05 NOTE — Telephone Encounter (Signed)
ENTERED IN ERROR

## 2013-03-06 ENCOUNTER — Telehealth: Payer: Self-pay

## 2013-03-06 MED ORDER — MELOXICAM 7.5 MG PO TABS: 7.5000 mg | ORAL_TABLET | Freq: Every day | ORAL | Status: DC

## 2013-03-06 NOTE — Telephone Encounter (Signed)
Tripp from [facility] left v/m that pt got generic rx for mobic from walk in clinic and request refill. Tripp said pt is sure Dr Alphonsus Sias would refill.Tripp with [facility] request cb today. Mobic not on med list.Please advise. Spoke with Jesusita Oka pts son, pt having shoulder pain and pt broke rt arm in fall several yrs ago and has been having pain in shoulder and arm for awhile. Pt has been doing therapy on arm. Jesusita Oka said if generic mobic does not help will schedule appt.Please advise.Jesusita Oka said today has given pt ibuprofen for pain since does not have meloxicam.

## 2013-03-06 NOTE — Telephone Encounter (Signed)
I will provided pt with a short term refill but she needs to discuss with PCP on his return for longer treatment course given her age and associated risks with NSAIDs.  Do not use ibuprofen at the same time.

## 2013-03-06 NOTE — Telephone Encounter (Signed)
Don notified Mobic has been sent to pharmacy.  Advised not to take Mobic and ibuprofen at the same time.  Follow up with Dr. Alphonsus Sias if pain continues and needs more Mobic.

## 2013-03-09 ENCOUNTER — Telehealth: Payer: Self-pay | Admitting: Internal Medicine

## 2013-03-09 NOTE — Telephone Encounter (Signed)
Daughter in law called Mom moved to [facility] Someone there may be calling for refills [facility] a [facility] but they are still using [facility], Please check recent Rx to see if they need to be resent to [facility]

## 2013-03-19 ENCOUNTER — Ambulatory Visit: Payer: Self-pay | Admitting: Oncology

## 2013-03-30 LAB — COMPREHENSIVE METABOLIC PANEL
ALK PHOS: 72 U/L
ANION GAP: 7 (ref 7–16)
Albumin: 3.2 g/dL — ABNORMAL LOW (ref 3.4–5.0)
BUN: 20 mg/dL — ABNORMAL HIGH (ref 7–18)
Bilirubin,Total: 0.2 mg/dL (ref 0.2–1.0)
CREATININE: 0.78 mg/dL (ref 0.60–1.30)
Calcium, Total: 9.2 mg/dL (ref 8.5–10.1)
Chloride: 103 mmol/L (ref 98–107)
Co2: 29 mmol/L (ref 21–32)
EGFR (Non-African Amer.): >60
Glucose: 123 mg/dL — ABNORMAL HIGH (ref 65–99)
Osmolality: 282 (ref 275–301)
Potassium: 4.2 mmol/L (ref 3.5–5.1)
SGOT(AST): 27 U/L (ref 15–37)
SGPT (ALT): 39 U/L (ref 12–78)
SODIUM: 139 mmol/L (ref 136–145)
TOTAL PROTEIN: 7.3 g/dL (ref 6.4–8.2)

## 2013-03-30 LAB — CBC CANCER CENTER
BASOS PCT: 0.8 %
Basophil #: 0.1 x10 3/mm (ref 0.0–0.1)
Eosinophil #: 0.1 x10 3/mm (ref 0.0–0.7)
Eosinophil %: 1.3 %
HCT: 35.9 % (ref 35.0–47.0)
HGB: 11.4 g/dL — AB (ref 12.0–16.0)
Lymphocyte #: 2.1 x10 3/mm (ref 1.0–3.6)
Lymphocyte %: 28.1 %
MCH: 27.8 pg (ref 26.0–34.0)
MCHC: 31.8 g/dL — ABNORMAL LOW (ref 32.0–36.0)
MCV: 88 fL (ref 80–100)
Monocyte #: 0.7 x10 3/mm (ref 0.2–0.9)
Monocyte %: 9.6 %
Neutrophil #: 4.4 x10 3/mm (ref 1.4–6.5)
Neutrophil %: 60.2 %
PLATELETS: 239 x10 3/mm (ref 150–440)
RBC: 4.1 10*6/uL (ref 3.80–5.20)
RDW: 14.9 % — ABNORMAL HIGH (ref 11.5–14.5)
WBC: 7.4 x10 3/mm (ref 3.6–11.0)

## 2013-04-16 LAB — HM DIABETES EYE EXAM

## 2013-04-19 ENCOUNTER — Ambulatory Visit: Payer: Self-pay | Admitting: Oncology

## 2013-04-27 ENCOUNTER — Ambulatory Visit: Payer: Self-pay | Admitting: Oncology

## 2013-04-30 ENCOUNTER — Ambulatory Visit (INDEPENDENT_AMBULATORY_CARE_PROVIDER_SITE_OTHER): Payer: Medicare Other | Admitting: Internal Medicine

## 2013-04-30 ENCOUNTER — Encounter: Payer: Self-pay | Admitting: Internal Medicine

## 2013-04-30 VITALS — BP 130/60 | HR 56 | Temp 98.1°F | Wt 118.0 lb

## 2013-04-30 DIAGNOSIS — I6990 Unspecified sequelae of unspecified cerebrovascular disease: Secondary | ICD-10-CM

## 2013-04-30 DIAGNOSIS — D3A00 Benign carcinoid tumor of unspecified site: Secondary | ICD-10-CM

## 2013-04-30 DIAGNOSIS — E119 Type 2 diabetes mellitus without complications: Secondary | ICD-10-CM

## 2013-04-30 DIAGNOSIS — I10 Essential (primary) hypertension: Secondary | ICD-10-CM

## 2013-04-30 LAB — HM DIABETES FOOT EXAM

## 2013-04-30 NOTE — Assessment & Plan Note (Signed)
Lab Results  Component Value Date   HGBA1C 7.4* 01/20/2013   No meds for now Careful with eating

## 2013-04-30 NOTE — Progress Notes (Signed)
Subjective:    Patient ID: Sophia Hill, female    DOB: [birth year], 80 y.o.   MRN: [id number]  HPI Here with son Doing well Still lives at [facility] with this Continues on monthly sandostatin---carcinoid seems to be quiet  No chest pain No SOB No dizziness or syncope No edema  Walks with rollator [place] once-- some time ago (slipped getting into her bed) Has been getting PT twice a week Family has hired Engineer, production for twice a week baths, laundry, etc  Current Outpatient Prescriptions on File Prior to Visit  Medication Sig Dispense Refill  . aspirin 81 MG tablet Take 81 mg by mouth daily.      . Cholecalciferol (VITAMIN D) 2000 UNITS CAPS Take by mouth daily.      . cloNIDine (CATAPRES - DOSED IN MG/24 HR) 0.1 mg/24hr patch Place 1 patch (0.1 mg total) onto the skin once a week.  4 patch  11  . docusate sodium (STOOL SOFTENER) 100 MG capsule Take 100 mg by mouth as needed.      Marland Kitchen lisinopril-hydrochlorothiazide (PRINZIDE,ZESTORETIC) 20-25 MG per tablet Take 1 tablet by mouth daily.  30 tablet  11  . Multiple Vitamins-Minerals (CENTRUM SILVER ADULT 50+) TABS Take by mouth daily.       No current facility-administered medications on file prior to visit.    Allergies  Allergen Reactions  . Augmentin [Amoxicillin-Pot Clavulanate] Rash    Past Medical History  Diagnosis Date  . Hyperlipidemia   . Hypertension   . Carcinoid tumor 2005    [facility]  . Depression   . Late effects of CVA (cerebrovascular accident)   . Osteoarthritis, multiple sites   . Anemia     iron deficiency in the past  . Type II or unspecified type diabetes mellitus without mention of complication, not stated as uncontrolled     Past Surgical History  Procedure Laterality Date  . Tonsillectomy    . Colon resection  2005    for carcinoid  . Hemorrhoid surgery      Family History  Problem Relation Age of Onset  . Stroke Mother   . Hypertension Mother     . Cancer Father   . Heart disease Father   . Diabetes Brother     History   Social History  . Marital Status: Widowed    Spouse Name: N/A    Number of Children: 2  . Years of Education: N/A   Occupational History  . Retired-- [employer] History Main Topics  . Smoking status: Never Smoker   . Smokeless tobacco: Never Used  . Alcohol Use: No  . Drug Use: No  . Sexual Activity: Not on file   Other Topics Concern  . Not on file   Social History Narrative   Has living will   Son is health care POA   Requests DNR--done 11/02/11   No tube feeds if cognitively unaware   Review of Systems Appetite is good Bowels are fine---will have spell of fecal urgency after treatment Sleeps okay Has chronic watery rhinorrhea    Objective:   Physical Exam  Constitutional: She appears well-developed and well-nourished. No distress.  Neck: Normal range of motion. Neck supple. No thyromegaly present.  Cardiovascular: Normal rate and normal heart sounds.  Exam reveals no gallop.   Gr 3/6 systolic murmur Pedal pulses not palpated  Pulmonary/Chest: Effort normal and breath sounds normal. No respiratory distress. She  has no wheezes. She has no rales.  Abdominal: Soft. There is no tenderness.  Musculoskeletal: She exhibits no edema and no tenderness.  Lymphadenopathy:    She has no cervical adenopathy.  Psychiatric: She has a normal mood and affect. Her behavior is normal.          Assessment & Plan:

## 2013-04-30 NOTE — Assessment & Plan Note (Signed)
BP Readings from Last 3 Encounters:  04/30/13 130/60  10/27/12 150/60  05/05/12 160/60   Good control now

## 2013-04-30 NOTE — Progress Notes (Signed)
Pre-visit discussion using our clinic review tool. No additional management support is needed unless otherwise documented below in the visit note.  

## 2013-04-30 NOTE — Assessment & Plan Note (Signed)
Minimal residual deficit

## 2013-04-30 NOTE — Assessment & Plan Note (Signed)
Still doing well on the monthly treatments

## 2013-05-01 ENCOUNTER — Telehealth: Payer: Self-pay | Admitting: Internal Medicine

## 2013-05-01 ENCOUNTER — Telehealth: Payer: Self-pay

## 2013-05-01 NOTE — Telephone Encounter (Signed)
Relevant patient education assigned to patient using Emmi. ° °

## 2013-05-06 ENCOUNTER — Telehealth: Payer: Self-pay | Admitting: Internal Medicine

## 2013-05-06 MED ORDER — SULFAMETHOXAZOLE-TMP DS 800-160 MG PO TABS: 1.0000 | ORAL_TABLET | Freq: Two times a day (BID) | ORAL | Status: DC

## 2013-05-06 NOTE — Telephone Encounter (Signed)
Pt's daughter-in-law is calling concerning pt. They think she has UTI with frequent urination but no burning. Pt's son and daughter-in-law are currently in teh hospital from pt's son having knee replacement surgery and can't bring her in for a visit. Pt is at [facility] independent living. They are wanting to know if something could be called because she is diabetic as well. Pt uses [facility] and they deliver. Please advise. Pt is allergic to penicillin and Augmentin.

## 2013-05-06 NOTE — Telephone Encounter (Signed)
Okay to send Rx for septra DS 1 bid #6 x 0 If her symptoms persist, she will need an OV

## 2013-05-06 NOTE — Telephone Encounter (Signed)
Left detailed VM on daughter in-law's phone with results, advised to call back if any questions rx sent to pharmacy by e-script

## 2013-05-09 ENCOUNTER — Inpatient Hospital Stay: Payer: Self-pay | Admitting: Internal Medicine

## 2013-05-09 LAB — URINALYSIS, COMPLETE
Bilirubin,UR: NEGATIVE
Blood: NEGATIVE
Glucose,UR: NEGATIVE mg/dL (ref 0–75)
Ketone: NEGATIVE
Leukocyte Esterase: NEGATIVE
Nitrite: NEGATIVE
Ph: 5 (ref 4.5–8.0)
Protein: NEGATIVE
RBC,UR: 4 /HPF (ref 0–5)
Specific Gravity: 1.013 (ref 1.003–1.030)
Squamous Epithelial: 1
WBC UR: 2 /HPF (ref 0–5)

## 2013-05-09 LAB — CBC WITH DIFFERENTIAL/PLATELET
BASOS PCT: 0.6 %
Basophil #: 0 10*3/uL (ref 0.0–0.1)
Eosinophil #: 0 10*3/uL (ref 0.0–0.7)
Eosinophil %: 0 %
HCT: 35.2 % (ref 35.0–47.0)
HGB: 11.9 g/dL — ABNORMAL LOW (ref 12.0–16.0)
Lymphocyte #: 1.1 10*3/uL (ref 1.0–3.6)
Lymphocyte %: 15 %
MCH: 29.3 pg (ref 26.0–34.0)
MCHC: 34 g/dL (ref 32.0–36.0)
MCV: 86 fL (ref 80–100)
MONOS PCT: 8.8 %
Monocyte #: 0.6 x10 3/mm (ref 0.2–0.9)
Neutrophil #: 5.5 10*3/uL (ref 1.4–6.5)
Neutrophil %: 75.6 %
Platelet: 162 10*3/uL (ref 150–440)
RBC: 4.08 10*6/uL (ref 3.80–5.20)
RDW: 16.2 % — ABNORMAL HIGH (ref 11.5–14.5)
WBC: 7.3 10*3/uL (ref 3.6–11.0)

## 2013-05-09 LAB — BASIC METABOLIC PANEL
Anion Gap: 1 — ABNORMAL LOW (ref 7–16)
BUN: 15 mg/dL (ref 7–18)
Calcium, Total: 9.3 mg/dL (ref 8.5–10.1)
Chloride: 94 mmol/L — ABNORMAL LOW (ref 98–107)
Co2: 32 mmol/L (ref 21–32)
Creatinine: 0.97 mg/dL (ref 0.60–1.30)
EGFR (African American): >60
EGFR (Non-African Amer.): 52 — ABNORMAL LOW
Glucose: 144 mg/dL — ABNORMAL HIGH (ref 65–99)
Osmolality: 259 (ref 275–301)
Potassium: 4.3 mmol/L (ref 3.5–5.1)
Sodium: 127 mmol/L — ABNORMAL LOW (ref 136–145)

## 2013-05-09 LAB — SODIUM, URINE, RANDOM: SODIUM, URINE RANDOM: 97 mmol/L (ref 20–110)

## 2013-05-10 LAB — BASIC METABOLIC PANEL
Anion Gap: 5 — ABNORMAL LOW (ref 7–16)
BUN: 16 mg/dL (ref 7–18)
CALCIUM: 8.3 mg/dL — AB (ref 8.5–10.1)
CO2: 26 mmol/L (ref 21–32)
CREATININE: 0.91 mg/dL (ref 0.60–1.30)
Chloride: 98 mmol/L (ref 98–107)
EGFR (African American): >60
EGFR (Non-African Amer.): 56 — ABNORMAL LOW
Glucose: 130 mg/dL — ABNORMAL HIGH (ref 65–99)
Osmolality: 262 (ref 275–301)
Potassium: 3.9 mmol/L (ref 3.5–5.1)
Sodium: 129 mmol/L — ABNORMAL LOW (ref 136–145)

## 2013-05-10 LAB — CBC WITH DIFFERENTIAL/PLATELET
Basophil #: 0.1 10*3/uL (ref 0.0–0.1)
Basophil %: 1.1 %
EOS PCT: 0.9 %
Eosinophil #: 0.1 10*3/uL (ref 0.0–0.7)
HCT: 30.7 % — AB (ref 35.0–47.0)
HGB: 10.1 g/dL — ABNORMAL LOW (ref 12.0–16.0)
LYMPHS ABS: 1.1 10*3/uL (ref 1.0–3.6)
LYMPHS PCT: 18.2 %
MCH: 28.3 pg (ref 26.0–34.0)
MCHC: 32.8 g/dL (ref 32.0–36.0)
MCV: 86 fL (ref 80–100)
MONO ABS: 0.7 x10 3/mm (ref 0.2–0.9)
MONOS PCT: 11.6 %
NEUTROS PCT: 68.2 %
Neutrophil #: 4 10*3/uL (ref 1.4–6.5)
Platelet: 147 10*3/uL — ABNORMAL LOW (ref 150–440)
RBC: 3.56 10*6/uL — ABNORMAL LOW (ref 3.80–5.20)
RDW: 16 % — AB (ref 11.5–14.5)
WBC: 5.9 10*3/uL (ref 3.6–11.0)

## 2013-05-11 ENCOUNTER — Telehealth: Payer: Self-pay | Admitting: Internal Medicine

## 2013-05-11 LAB — BASIC METABOLIC PANEL
ANION GAP: 5 — AB (ref 7–16)
BUN: 14 mg/dL (ref 7–18)
Calcium, Total: 8.4 mg/dL — ABNORMAL LOW (ref 8.5–10.1)
Chloride: 103 mmol/L (ref 98–107)
Co2: 25 mmol/L (ref 21–32)
Creatinine: 0.7 mg/dL (ref 0.60–1.30)
EGFR (African American): >60
EGFR (Non-African Amer.): >60
Glucose: 118 mg/dL — ABNORMAL HIGH (ref 65–99)
OSMOLALITY: 268 (ref 275–301)
Potassium: 3.8 mmol/L (ref 3.5–5.1)
Sodium: 133 mmol/L — ABNORMAL LOW (ref 136–145)

## 2013-05-11 LAB — URINE CULTURE

## 2013-05-11 NOTE — Telephone Encounter (Signed)
Admitted to [facility] 2/21 with generalized weakness Found to have sodium of 127 Hydrated (though BUN only 15) and HCTZ held Feels better now She is going home today Follow up scheduled already for 3/3

## 2013-05-15 ENCOUNTER — Other Ambulatory Visit: Payer: Self-pay | Admitting: *Deleted

## 2013-05-17 ENCOUNTER — Ambulatory Visit: Payer: Self-pay | Admitting: Oncology

## 2013-05-19 ENCOUNTER — Ambulatory Visit (INDEPENDENT_AMBULATORY_CARE_PROVIDER_SITE_OTHER): Payer: Medicare Other | Admitting: Internal Medicine

## 2013-05-19 ENCOUNTER — Ambulatory Visit: Payer: Medicare Other | Admitting: Internal Medicine

## 2013-05-19 ENCOUNTER — Encounter: Payer: Self-pay | Admitting: Internal Medicine

## 2013-05-19 VITALS — BP 150/62 | HR 58 | Temp 98.5°F | Wt 120.0 lb

## 2013-05-19 DIAGNOSIS — R300 Dysuria: Secondary | ICD-10-CM | POA: Insufficient documentation

## 2013-05-19 DIAGNOSIS — I10 Essential (primary) hypertension: Secondary | ICD-10-CM

## 2013-05-19 DIAGNOSIS — E871 Hypo-osmolality and hyponatremia: Secondary | ICD-10-CM | POA: Insufficient documentation

## 2013-05-19 LAB — POCT URINALYSIS DIPSTICK
Bilirubin, UA: NEGATIVE
Glucose, UA: NEGATIVE
Ketones, UA: NEGATIVE
Leukocytes, UA: NEGATIVE
Nitrite, UA: NEGATIVE
PH UA: 6.5
SPEC GRAV UA: 1.015
UROBILINOGEN UA: NEGATIVE

## 2013-05-19 LAB — BASIC METABOLIC PANEL
BUN: 11 mg/dL (ref 6–23)
CO2: 30 mEq/L (ref 19–32)
Calcium: 9.2 mg/dL (ref 8.4–10.5)
Chloride: 97 mEq/L (ref 96–112)
Creatinine, Ser: 0.6 mg/dL (ref 0.4–1.2)
GFR: 108.2 mL/min (ref >60.00)
GLUCOSE: 113 mg/dL — AB (ref 70–99)
POTASSIUM: 3.8 meq/L (ref 3.5–5.1)
Sodium: 135 mEq/L (ref 135–145)

## 2013-05-19 NOTE — Assessment & Plan Note (Signed)
Not sure this is all from the HCTZ---she had had some diarrhea also Will stay off the HCTZ anyway Recheck labs today

## 2013-05-19 NOTE — Assessment & Plan Note (Signed)
BP Readings from Last 3 Encounters:  05/19/13 150/62  04/30/13 130/60  10/27/12 150/60   Still okay for age without the HCTZ Stay with just the lisinopril and clonidine

## 2013-05-19 NOTE — Assessment & Plan Note (Signed)
Better now but still slight symptoms Urinalysis is fairly benign Will keep up with fluids ?cranberry tabs

## 2013-05-19 NOTE — Progress Notes (Signed)
Subjective:    Patient ID: Sophia Hill, female    DOB: Apr 04, 1921, 80 y.o.   MRN: [id number]  HPI Here with DIL Reviewed [facility] hospital records  Feeling some better now Off the HCTZ now Had diarrhea and incontinence before the hospitalization---hadn't told them that Might be related to the bactrim  Still has slight burning dysuria Hemorrhoids acted up but no rash or discharge No fever Not as much frequency   Energy levels are better but not back to normal Walking with her rollator still No chest pain No SOB  Current Outpatient Prescriptions on File Prior to Visit  Medication Sig Dispense Refill  . aspirin 81 MG tablet Take 81 mg by mouth daily.      . Cholecalciferol (VITAMIN D) 2000 UNITS CAPS Take by mouth daily.      . cloNIDine (CATAPRES - DOSED IN MG/24 HR) 0.1 mg/24hr patch Place 1 patch (0.1 mg total) onto the skin once a week.  4 patch  11  . docusate sodium (STOOL SOFTENER) 100 MG capsule Take 100 mg by mouth as needed.      Marland Kitchen lisinopril (PRINIVIL,ZESTRIL) 20 MG tablet Take 1 tablet (20 mg total) by mouth daily.  90 tablet  3  . Multiple Vitamins-Minerals (CENTRUM SILVER ADULT 50+) TABS Take by mouth daily.       No current facility-administered medications on file prior to visit.    Allergies  Allergen Reactions  . Penicillin G     Other reaction(s): Unknown  . Augmentin [Amoxicillin-Pot Clavulanate] Rash    Past Medical History  Diagnosis Date  . Hyperlipidemia   . Hypertension   . Carcinoid tumor 2005    [facility]  . Depression   . Late effects of CVA (cerebrovascular accident)   . Osteoarthritis, multiple sites   . Anemia     iron deficiency in the past  . Type II or unspecified type diabetes mellitus without mention of complication, not stated as uncontrolled     Past Surgical History  Procedure Laterality Date  . Tonsillectomy    . Colon resection  2005    for carcinoid  . Hemorrhoid surgery       Family History  Problem Relation Age of Onset  . Stroke Mother   . Hypertension Mother   . Cancer Father   . Heart disease Father   . Diabetes Brother     History   Social History  . Marital Status: Widowed    Spouse Name: N/A    Number of Children: 2  . Years of Education: N/A   Occupational History  . Retired-- [employer] History Main Topics  . Smoking status: Never Smoker   . Smokeless tobacco: Never Used  . Alcohol Use: No  . Drug Use: No  . Sexual Activity: Not on file   Other Topics Concern  . Not on file   Social History Narrative   Has living will   Son is health care POA   Requests DNR--done 11/02/11   No tube feeds if cognitively unaware   Review of Systems Appetite is good Weight is up 2# since last visit      Objective:   Physical Exam  Constitutional: She appears well-developed. No distress.  Neck: Normal range of motion. Neck supple. No thyromegaly present.  Cardiovascular: Normal rate, regular rhythm and normal heart sounds.  Exam reveals no gallop.   No murmur heard. Pulmonary/Chest: Effort normal  and breath sounds normal. No respiratory distress. She has no wheezes. She has no rales.  Abdominal: Soft. Bowel sounds are normal. She exhibits no distension and no mass. There is no tenderness. There is no rebound and no guarding.  Musculoskeletal: She exhibits no edema and no tenderness.  Lymphadenopathy:    She has no cervical adenopathy.  Psychiatric: She has a normal mood and affect. Her behavior is normal.          Assessment & Plan:

## 2013-05-19 NOTE — Progress Notes (Signed)
Pre visit review using our clinic review tool, if applicable. No additional management support is needed unless otherwise documented below in the visit note. 

## 2013-05-20 ENCOUNTER — Telehealth: Payer: Self-pay | Admitting: Internal Medicine

## 2013-05-20 NOTE — Telephone Encounter (Signed)
Relevant patient education mailed to patient.  

## 2013-05-29 ENCOUNTER — Ambulatory Visit (INDEPENDENT_AMBULATORY_CARE_PROVIDER_SITE_OTHER): Payer: Medicare Other | Admitting: Family Medicine

## 2013-05-29 ENCOUNTER — Telehealth: Payer: Self-pay | Admitting: Internal Medicine

## 2013-05-29 ENCOUNTER — Encounter: Payer: Self-pay | Admitting: Family Medicine

## 2013-05-29 VITALS — BP 148/60 | HR 70 | Temp 98.4°F | Wt 117.4 lb

## 2013-05-29 DIAGNOSIS — J069 Acute upper respiratory infection, unspecified: Secondary | ICD-10-CM

## 2013-05-29 DIAGNOSIS — B9789 Other viral agents as the cause of diseases classified elsewhere: Principal | ICD-10-CM

## 2013-05-29 MED ORDER — BENZONATATE 100 MG PO CAPS: 100.0000 mg | ORAL_CAPSULE | Freq: Two times a day (BID) | ORAL | Status: DC | PRN

## 2013-05-29 MED ORDER — AZITHROMYCIN 250 MG PO TABS: ORAL_TABLET | ORAL | Status: DC

## 2013-05-29 NOTE — Progress Notes (Addendum)
BP 148/60  Pulse 70  Temp(Src) 98.4 F (36.9 C) (Oral)  Wt 117 lb 6.4 oz (53.252 kg)  SpO2 97%   CC: cough  Subjective:    Patient ID: TANAKA GILLEN, female    DOB: 03/28/1921, 80 y.o.   MRN: [id number]  HPI: SILVIA HIGHTOWER is a 80 y.o. female presenting on 05/29/2013 for URI   Presents with DIL [name].  Pleasant 80 yo pt of Dr. Alla German with h/o HTN, HLD, depression, DM, h/o CVA and carcinoid tumor (q monthly injections) presents today with 5d h/o dry nonproductive cough , hoarseness.  sxs started with ST, + PNdrainage.  Some chest pain from coughing.  Possible low grade fever.  Feels throat and chest congestion, not in head.  Mild dyspnea mainly with cough  No fevers/chills, abd pain, nausea, headache, ear or tooth pain.  No wheezing Treated with zyrtec, tea and cough drops.  + sick contacts at home - lives in independent living. No smokers at home. No h/o asthma. Recent CXR showed COPD - recently hospitalized for hyponatremia.   Relevant past medical, surgical, family and social history reviewed and updated as indicated.  Allergies and medications reviewed and updated. Current Outpatient Prescriptions on File Prior to Visit  Medication Sig  . aspirin 81 MG tablet Take 81 mg by mouth daily.  . Cholecalciferol (VITAMIN D) 2000 UNITS CAPS Take by mouth daily.  . cloNIDine (CATAPRES - DOSED IN MG/24 HR) 0.1 mg/24hr patch Place 1 patch (0.1 mg total) onto the skin once a week.  . docusate sodium (STOOL SOFTENER) 100 MG capsule Take 100 mg by mouth as needed.  Marland Kitchen lisinopril (PRINIVIL,ZESTRIL) 20 MG tablet Take 1 tablet (20 mg total) by mouth daily.  . Multiple Vitamins-Minerals (CENTRUM SILVER ADULT 50+) TABS Take by mouth daily.   No current facility-administered medications on file prior to visit.    Review of Systems Per HPI unless specifically indicated above    Objective:    BP 148/60  Pulse 70  Temp(Src) 98.4 F (36.9 C) (Oral)  Wt 117 lb 6.4 oz (53.252 kg)   SpO2 97%  Physical Exam  Nursing note and vitals reviewed. Constitutional: She appears well-developed and well-nourished. No distress.  HENT:  Head: Normocephalic and atraumatic.  Right Ear: Hearing, external ear and ear canal normal.  Left Ear: Hearing, tympanic membrane, external ear and ear canal normal.  Nose: Mucosal edema present. No rhinorrhea. Right sinus exhibits maxillary sinus tenderness. Right sinus exhibits no frontal sinus tenderness. Left sinus exhibits maxillary sinus tenderness. Left sinus exhibits no frontal sinus tenderness.  Mouth/Throat: Uvula is midline and mucous membranes are normal. Posterior oropharyngeal erythema present. No oropharyngeal exudate, posterior oropharyngeal edema or tonsillar abscesses.  R TM covered by cerumen  Eyes: Conjunctivae and EOM are normal. Pupils are equal, round, and reactive to light. No scleral icterus.  Neck: Normal range of motion. Neck supple.  Cardiovascular: Normal rate, regular rhythm and intact distal pulses.   Murmur (SEM) heard. Pulmonary/Chest: Effort normal and breath sounds normal. No respiratory distress. She has no wheezes. She has no rales.  Lymphadenopathy:    She has no cervical adenopathy.  Skin: Skin is warm and dry. No rash noted.       Assessment & Plan:   Problem List Items Addressed This Visit   Viral URI with cough - Primary     Anticipate viral given short duration, lungs clear on exam today. Tessalon perls for cough Given frailty and comorbidities, provided with  WASP for zpack to fill if not improving or any worsening sxs. See pt instructions for supportive care. Pt/DIL agree with plan.    Relevant Medications      azithromycin (ZITHROMAX) tablet       Follow up plan: Return if symptoms worsen or fail to improve.

## 2013-05-29 NOTE — Telephone Encounter (Signed)
pls schedule this with Dr Darnell Level at [phone]. Ok per Dr Darnell Level

## 2013-05-29 NOTE — Patient Instructions (Signed)
I think you have an upper respiratory infection, likely viral. I would monitor symptoms for the next few days for improvement. Viral infections usually take 7-10 days to improve. Use medication as prescribed: tessalon perls for cough, continue cough drops. Push fluids and plenty of rest. Please return if you are not improving as expected, or if you have high fevers (>101.5) or difficulty swallowing or worsening productive cough. Call clinic with questions.  Good to see you today.

## 2013-05-29 NOTE — Telephone Encounter (Signed)
Pt's daughter is calling and says the nurse at [facility] called her and pt is coughing so bad that it is hurting pt's chest. I told pt's daughter that Dr. Silvio Pate is out of office but I would send this over to Dr. Deborra Medina to see what we should do. Pt asks if she should bring pt in for a visit ??? Or if something could be called into her pharmacy at [facility] in [place]. Pt is allergic to Augmentin and Penicillin. And has trouble swolling pills would prefer a liquid. Please advise

## 2013-05-29 NOTE — Telephone Encounter (Signed)
Dr Deborra Medina is not in office as of yet

## 2013-05-29 NOTE — Assessment & Plan Note (Signed)
Anticipate viral given short duration, lungs clear on exam today. Tessalon perls for cough Given frailty and comorbidities, provided with WASP for zpack to fill if not improving or any worsening sxs. See pt instructions for supportive care. Pt/DIL agree with plan.

## 2013-05-29 NOTE — Telephone Encounter (Signed)
Scheduled

## 2013-05-29 NOTE — Telephone Encounter (Signed)
Just getting this now - plz get more info - productive cough? Fever? New cough?   Recommend she be evaluated today - if another provider has availability or may place at 4:30pm on my schedule.

## 2013-05-29 NOTE — Progress Notes (Signed)
Pre visit review using our clinic review tool, if applicable. No additional management support is needed unless otherwise documented below in the visit note. 

## 2013-06-08 ENCOUNTER — Other Ambulatory Visit: Payer: Self-pay

## 2013-06-08 MED ORDER — LISINOPRIL 20 MG PO TABS: 20.0000 mg | ORAL_TABLET | Freq: Every day | ORAL | Status: DC

## 2013-06-08 NOTE — Telephone Encounter (Signed)
Sophia Hill with [facility] left v/m requesting refill lisinopril. Done.

## 2013-06-17 ENCOUNTER — Ambulatory Visit: Payer: Self-pay | Admitting: Oncology

## 2013-07-08 ENCOUNTER — Telehealth: Payer: Self-pay

## 2013-07-08 NOTE — Telephone Encounter (Signed)
Has been fine here I am reluctant to be aggressive with Rx given her other medical issues If no symptoms, would just watch If stays up or she has symptoms, would set up an appt

## 2013-07-08 NOTE — Telephone Encounter (Signed)
Caryl Pina with [employer] said for last 3 days pt BP has ran 170 -182 / 60. Today BP 170/60 P 60. Asymptomatic, no H/A, dizziness, blurred vision or CP. Pt has been taking her meds. Caryl Pina request cb to pts son Linna Hoff [phone].

## 2013-07-09 NOTE — Telephone Encounter (Signed)
Spoke with patient's son and advised results. Pt is coming in tomorrow for UTI will also discuss the BP

## 2013-07-10 ENCOUNTER — Encounter: Payer: Self-pay | Admitting: Internal Medicine

## 2013-07-10 ENCOUNTER — Ambulatory Visit (INDEPENDENT_AMBULATORY_CARE_PROVIDER_SITE_OTHER): Payer: Medicare Other | Admitting: Internal Medicine

## 2013-07-10 VITALS — BP 128/60 | HR 80 | Temp 98.4°F | Wt 116.0 lb

## 2013-07-10 DIAGNOSIS — R300 Dysuria: Secondary | ICD-10-CM

## 2013-07-10 DIAGNOSIS — I10 Essential (primary) hypertension: Secondary | ICD-10-CM

## 2013-07-10 DIAGNOSIS — N390 Urinary tract infection, site not specified: Secondary | ICD-10-CM | POA: Insufficient documentation

## 2013-07-10 LAB — POCT URINALYSIS DIPSTICK
Bilirubin, UA: NEGATIVE
GLUCOSE UA: NEGATIVE
Ketones, UA: NEGATIVE
Leukocytes, UA: NEGATIVE
Nitrite, UA: POSITIVE
SPEC GRAV UA: 1.025
Urobilinogen, UA: NEGATIVE
pH, UA: 6.5

## 2013-07-10 MED ORDER — FLUCONAZOLE 100 MG PO TABS: 100.0000 mg | ORAL_TABLET | Freq: Every day | ORAL | Status: DC

## 2013-07-10 MED ORDER — HYDROCORTISONE 2.5 % EX CREA: TOPICAL_CREAM | Freq: Three times a day (TID) | CUTANEOUS | Status: DC | PRN

## 2013-07-10 NOTE — Progress Notes (Signed)
Pre visit review using our clinic review tool, if applicable. No additional management support is needed unless otherwise documented below in the visit note. 

## 2013-07-10 NOTE — Assessment & Plan Note (Signed)
Given external appearance---this may be from yeast Will treat with fluconazole Send culture and use antibiotic prn Will give low strength cortisone cream for the inflammation topically

## 2013-07-10 NOTE — Assessment & Plan Note (Signed)
BP Readings from Last 3 Encounters:  07/10/13 128/60  05/29/13 148/60  72/09/47 096/28   Systolic BP 366 at first---then 128 after brief sitting No increase needed

## 2013-07-10 NOTE — Progress Notes (Signed)
Subjective:    Patient ID: Sophia Hill, female    DOB: 07/26/1921, 80 y.o.   MRN: [id number]  HPI Here with son  BP has been running up PT twice a week has been checking Up as high as systolic 785 Felt a little weak then--but had rushed to get down to the PT office No headaches No chest pain No dizziness No SOB No early fatigue with exertion  Having more burning dysuria Ongoing issues Has been using a OTC wife--discussed not to use that Has perineal irritation  Current Outpatient Prescriptions on File Prior to Visit  Medication Sig Dispense Refill  . aspirin 81 MG tablet Take 81 mg by mouth daily.      . cetirizine (ZYRTEC) 10 MG tablet Take 10 mg by mouth daily.      . Cholecalciferol (VITAMIN D) 2000 UNITS CAPS Take by mouth daily.      . cloNIDine (CATAPRES - DOSED IN MG/24 HR) 0.1 mg/24hr patch Place 1 patch (0.1 mg total) onto the skin once a week.  4 patch  11  . docusate sodium (STOOL SOFTENER) 100 MG capsule Take 100 mg by mouth as needed.      Marland Kitchen lisinopril (PRINIVIL,ZESTRIL) 20 MG tablet Take 1 tablet (20 mg total) by mouth daily.  90 tablet  1  . Multiple Vitamins-Minerals (CENTRUM SILVER ADULT 50+) TABS Take by mouth daily.       No current facility-administered medications on file prior to visit.    Allergies  Allergen Reactions  . Penicillin G     Other reaction(s): Unknown  . Augmentin [Amoxicillin-Pot Clavulanate] Rash    Past Medical History  Diagnosis Date  . Hyperlipidemia   . Hypertension   . Carcinoid tumor 2005    [facility]  . Depression   . Late effects of CVA (cerebrovascular accident)   . Osteoarthritis, multiple sites   . Anemia     iron deficiency in the past  . Type II or unspecified type diabetes mellitus without mention of complication, not stated as uncontrolled     Past Surgical History  Procedure Laterality Date  . Tonsillectomy    . Colon resection  2005    for carcinoid  .  Hemorrhoid surgery      Family History  Problem Relation Age of Onset  . Stroke Mother   . Hypertension Mother   . Cancer Father   . Heart disease Father   . Diabetes Brother     History   Social History  . Marital Status: Widowed    Spouse Name: N/A    Number of Children: 2  . Years of Education: N/A   Occupational History  . Retired-- [employer] History Main Topics  . Smoking status: Never Smoker   . Smokeless tobacco: Never Used  . Alcohol Use: No  . Drug Use: No  . Sexual Activity: Not on file   Other Topics Concern  . Not on file   Social History Narrative   Has living will   Son is health care POA   Requests DNR--done 11/02/11   No tube feeds if cognitively unaware   Review of Systems No fever No abdominal pain No nausea or vomiting No recent diarrhea    Objective:   Physical Exam  Constitutional: She appears well-developed and well-nourished. No distress.  Neck: Normal range of motion. Neck supple.  Cardiovascular: Normal rate and regular rhythm.  Exam reveals  no gallop.   Soft systolic murmur at left sternal border  Pulmonary/Chest: Effort normal and breath sounds normal. No respiratory distress. She has no wheezes. She has no rales.  Abdominal: Soft. There is no tenderness.  Genitourinary:  Vulvar erythema with white discharge No focal lesions or induration Not atrophic  Lymphadenopathy:    She has no cervical adenopathy.  Psychiatric: She has a normal mood and affect. Her behavior is normal.          Assessment & Plan:

## 2013-07-14 ENCOUNTER — Encounter: Payer: Self-pay | Admitting: Internal Medicine

## 2013-07-15 NOTE — Telephone Encounter (Signed)
I drew up a culture but you didn't tell me to send it.

## 2013-07-17 ENCOUNTER — Ambulatory Visit: Payer: Self-pay | Admitting: Oncology

## 2013-08-17 ENCOUNTER — Ambulatory Visit: Payer: Self-pay | Admitting: Oncology

## 2013-08-27 ENCOUNTER — Telehealth: Payer: Self-pay

## 2013-08-27 NOTE — Telephone Encounter (Signed)
Jerolyn Shin PT with [facility] left v/m; Anderson Malta saw pt this AM; pts BP was 180/60 P 60 after ambulating BP 192/60 P 64. No PT exercises were done today. Pt was not symptomatic and pts son said he would recheck BP later today.

## 2013-08-27 NOTE — Telephone Encounter (Signed)
Ensure regular with lisinopril 20mg  daily and clonidine patch 0.1mg  once a week. Ensure at least 30 min between food, drink, exercise, or stress and checking BP. Would suggest pt check bp at home over next week and call us next week with #s  BP Readings from Last 3 Encounters:  07/10/13 128/60  05/29/13 148/60  05/19/13 150/62   Lab Results  Component Value Date   CREATININE 0.6 05/19/2013

## 2013-08-27 NOTE — Telephone Encounter (Signed)
Message left notifying Sophia Hill. Spoke with patient's son, Sophia Hill and notified him of same instructions. He said that patient had just taken her lisinopril ~10 min prior to PT arriving and had also just changed her patch this AM as well. He said his wife was a [employer] scientist and keeps a very good check on her BP, so if there is any concern he will call, but he wasn't too concerned this AM since she had just taken her meds.

## 2013-09-01 ENCOUNTER — Other Ambulatory Visit: Payer: Self-pay | Admitting: *Deleted

## 2013-09-01 MED ORDER — LISINOPRIL 20 MG PO TABS: 20.0000 mg | ORAL_TABLET | Freq: Every day | ORAL | Status: DC

## 2013-09-14 LAB — COMPREHENSIVE METABOLIC PANEL
ALK PHOS: 74 U/L
Albumin: 3 g/dL — ABNORMAL LOW (ref 3.4–5.0)
Anion Gap: 3 — ABNORMAL LOW (ref 7–16)
BILIRUBIN TOTAL: 0.3 mg/dL (ref 0.2–1.0)
BUN: 13 mg/dL (ref 7–18)
Calcium, Total: 9 mg/dL (ref 8.5–10.1)
Chloride: 102 mmol/L (ref 98–107)
Co2: 34 mmol/L — ABNORMAL HIGH (ref 21–32)
Creatinine: 0.71 mg/dL (ref 0.60–1.30)
EGFR (African American): >60
EGFR (Non-African Amer.): >60
Glucose: 233 mg/dL — ABNORMAL HIGH (ref 65–99)
OSMOLALITY: 285 (ref 275–301)
POTASSIUM: 4 mmol/L (ref 3.5–5.1)
SGOT(AST): 24 U/L (ref 15–37)
SGPT (ALT): 37 U/L (ref 12–78)
SODIUM: 139 mmol/L (ref 136–145)
Total Protein: 7.3 g/dL (ref 6.4–8.2)

## 2013-09-14 LAB — CBC CANCER CENTER
Basophil #: 0.1 x10 3/mm (ref 0.0–0.1)
Basophil %: 1 %
EOS PCT: 1.1 %
Eosinophil #: 0.1 x10 3/mm (ref 0.0–0.7)
HCT: 36.9 % (ref 35.0–47.0)
HGB: 11.8 g/dL — AB (ref 12.0–16.0)
LYMPHS ABS: 1.7 x10 3/mm (ref 1.0–3.6)
Lymphocyte %: 27.3 %
MCH: 27.8 pg (ref 26.0–34.0)
MCHC: 32 g/dL (ref 32.0–36.0)
MCV: 87 fL (ref 80–100)
MONO ABS: 0.5 x10 3/mm (ref 0.2–0.9)
Monocyte %: 7.7 %
Neutrophil #: 3.9 x10 3/mm (ref 1.4–6.5)
Neutrophil %: 62.9 %
Platelet: 199 x10 3/mm (ref 150–440)
RBC: 4.25 10*6/uL (ref 3.80–5.20)
RDW: 15.6 % — AB (ref 11.5–14.5)
WBC: 6.1 x10 3/mm (ref 3.6–11.0)

## 2013-09-16 ENCOUNTER — Ambulatory Visit: Payer: Self-pay | Admitting: Oncology

## 2013-09-29 ENCOUNTER — Ambulatory Visit (INDEPENDENT_AMBULATORY_CARE_PROVIDER_SITE_OTHER): Payer: Medicare Other | Admitting: Internal Medicine

## 2013-09-29 ENCOUNTER — Encounter: Payer: Self-pay | Admitting: Internal Medicine

## 2013-09-29 VITALS — BP 160/60 | HR 64 | Temp 98.3°F | Wt 115.0 lb

## 2013-09-29 DIAGNOSIS — I10 Essential (primary) hypertension: Secondary | ICD-10-CM

## 2013-09-29 MED ORDER — CLONIDINE HCL 0.2 MG/24HR TD PTWK: 0.2000 mg | MEDICATED_PATCH | TRANSDERMAL | Status: DC

## 2013-09-29 NOTE — Progress Notes (Signed)
Subjective:    Patient ID: Sophia Hill, female    DOB: Nov 17, 1921, 80 y.o.   MRN: [id number]  HPI Here with son  Still notes episodic elevated BP As high as 180/62 and 208/68  7/11  190/65 and 7/13 160/62  She has sense of fatigue when her BP is high No flushing  No chest pain No SOB  Stable activity levels and tasks  Current Outpatient Prescriptions on File Prior to Visit  Medication Sig Dispense Refill  . aspirin 81 MG tablet Take 81 mg by mouth daily.      . Cholecalciferol (VITAMIN D) 2000 UNITS CAPS Take by mouth daily.      . cloNIDine (CATAPRES - DOSED IN MG/24 HR) 0.1 mg/24hr patch Place 1 patch (0.1 mg total) onto the skin once a week.  4 patch  11  . docusate sodium (STOOL SOFTENER) 100 MG capsule Take 100 mg by mouth as needed.      . hydrocortisone 2.5 % cream Apply topically 3 (three) times daily as needed. To irritated areas  30 g  1  . lisinopril (PRINIVIL,ZESTRIL) 20 MG tablet Take 1 tablet (20 mg total) by mouth daily.  90 tablet  1  . Multiple Vitamins-Minerals (CENTRUM SILVER ADULT 50+) TABS Take by mouth daily.       No current facility-administered medications on file prior to visit.    Allergies  Allergen Reactions  . Penicillin G     Other reaction(s): Unknown  . Augmentin [Amoxicillin-Pot Clavulanate] Rash    Past Medical History  Diagnosis Date  . Hyperlipidemia   . Hypertension   . Carcinoid tumor 2005    [facility]  . Depression   . Late effects of CVA (cerebrovascular accident)   . Osteoarthritis, multiple sites   . Anemia     iron deficiency in the past  . Type II or unspecified type diabetes mellitus without mention of complication, not stated as uncontrolled     Past Surgical History  Procedure Laterality Date  . Tonsillectomy    . Colon resection  2005    for carcinoid  . Hemorrhoid surgery      Family History  Problem Relation Age of Onset  . Stroke Mother   . Hypertension Mother    . Cancer Father   . Heart disease Father   . Diabetes Brother     History   Social History  . Marital Status: Widowed    Spouse Name: N/A    Number of Children: 2  . Years of Education: N/A   Occupational History  . Retired-- [employer] History Main Topics  . Smoking status: Never Smoker   . Smokeless tobacco: Never Used  . Alcohol Use: No  . Drug Use: No  . Sexual Activity: Not on file   Other Topics Concern  . Not on file   Social History Narrative   Has living will   Son is health care POA   Requests DNR--done 11/02/11   No tube feeds if cognitively unaware   Review of Systems Sleeps okay Appetite is still fine Weight is stable     Objective:   Physical Exam  Constitutional: She appears well-developed. No distress.  Neck: Normal range of motion. Neck supple. No thyromegaly present.  Cardiovascular: Normal rate and regular rhythm.  Exam reveals no gallop.   Gr 2/6 systolic murmur loudest over left sternal border  Pulmonary/Chest: Effort normal and breath  sounds normal. No respiratory distress. She has no wheezes. She has no rales.  Musculoskeletal: She exhibits no edema.  Lymphadenopathy:    She has no cervical adenopathy.  Psychiatric:  Mildly anxious          Assessment & Plan:

## 2013-09-29 NOTE — Assessment & Plan Note (Signed)
BP Readings from Last 3 Encounters:  09/29/13 160/60  07/10/13 128/60  05/29/13 148/60   She is very anxious about this--may be self fulfilling problem Gets it checked where she lives--told her to limit this to once a week or so May have episodic elevated BP due to carcinoid ---despite regular Rx Discussed more Rx---will need to watch for orthostatic hypotension or other side effects Will increase the clonidine patch

## 2013-09-29 NOTE — Patient Instructions (Signed)
Please increase the clonidine patch to the 0.2mg  dose.

## 2013-09-29 NOTE — Progress Notes (Signed)
Pre visit review using our clinic review tool, if applicable. No additional management support is needed unless otherwise documented below in the visit note. 

## 2013-10-17 ENCOUNTER — Ambulatory Visit: Payer: Self-pay | Admitting: Oncology

## 2013-10-26 ENCOUNTER — Encounter: Payer: Self-pay | Admitting: Internal Medicine

## 2013-10-26 ENCOUNTER — Ambulatory Visit (INDEPENDENT_AMBULATORY_CARE_PROVIDER_SITE_OTHER): Payer: Medicare Other | Admitting: Internal Medicine

## 2013-10-26 VITALS — BP 158/56 | HR 60 | Temp 98.0°F | Wt 116.0 lb

## 2013-10-26 DIAGNOSIS — I158 Other secondary hypertension: Secondary | ICD-10-CM

## 2013-10-26 DIAGNOSIS — E349 Endocrine disorder, unspecified: Secondary | ICD-10-CM

## 2013-10-26 DIAGNOSIS — I152 Hypertension secondary to endocrine disorders: Secondary | ICD-10-CM

## 2013-10-26 MED ORDER — AMLODIPINE BESYLATE 2.5 MG PO TABS: 2.5000 mg | ORAL_TABLET | Freq: Every day | ORAL | Status: DC

## 2013-10-26 NOTE — Progress Notes (Signed)
Subjective:    Patient ID: Sophia Hill, female    DOB: Jan 29, 1922, 80 y.o.   MRN: [id number]  HPI Here with son No problems on the higher clonidine patch No apparent improvement with the higher dose  Has had a couple of readings near 696 systolic No headaches No chest pain No palpitations  Still on monthly shots for carcinoid  Current Outpatient Prescriptions on File Prior to Visit  Medication Sig Dispense Refill  . aspirin 81 MG tablet Take 81 mg by mouth daily.      . Cholecalciferol (VITAMIN D) 2000 UNITS CAPS Take by mouth daily.      . cloNIDine (CATAPRES - DOSED IN MG/24 HR) 0.2 mg/24hr patch Place 1 patch (0.2 mg total) onto the skin once a week.  4 patch  12  . docusate sodium (STOOL SOFTENER) 100 MG capsule Take 100 mg by mouth as needed.      . hydrocortisone 2.5 % cream Apply topically 3 (three) times daily as needed. To irritated areas  30 g  1  . lisinopril (PRINIVIL,ZESTRIL) 20 MG tablet Take 1 tablet (20 mg total) by mouth daily.  90 tablet  1  . Multiple Vitamins-Minerals (CENTRUM SILVER ADULT 50+) TABS Take by mouth daily.      Marland Kitchen octreotide (SANDOSTATIN LAR DEPOT) 10 MG injection Inject 10 mg into the muscle every 28 (twenty-eight) days. Not sure of dose       No current facility-administered medications on file prior to visit.    Allergies  Allergen Reactions  . Penicillin G     Other reaction(s): Unknown  . Augmentin [Amoxicillin-Pot Clavulanate] Rash    Past Medical History  Diagnosis Date  . Hyperlipidemia   . Hypertension   . Carcinoid tumor 2005    [facility]  . Depression   . Late effects of CVA (cerebrovascular accident)   . Osteoarthritis, multiple sites   . Anemia     iron deficiency in the past  . Type II or unspecified type diabetes mellitus without mention of complication, not stated as uncontrolled     Past Surgical History  Procedure Laterality Date  . Tonsillectomy    . Colon resection   2005    for carcinoid  . Hemorrhoid surgery      Family History  Problem Relation Age of Onset  . Stroke Mother   . Hypertension Mother   . Cancer Father   . Heart disease Father   . Diabetes Brother     History   Social History  . Marital Status: Widowed    Spouse Name: N/A    Number of Children: 2  . Years of Education: N/A   Occupational History  . Retired-- [employer] History Main Topics  . Smoking status: Never Smoker   . Smokeless tobacco: Never Used  . Alcohol Use: No  . Drug Use: No  . Sexual Activity: Not on file   Other Topics Concern  . Not on file   Social History Narrative   Has living will   Son is health care POA   Requests DNR--done 11/02/11   No tube feeds if cognitively unaware   Review of Systems Recent eye exam---cataract but otherwise fine Sleeps fairly well    Objective:   Physical Exam  Constitutional: She appears well-developed and well-nourished. No distress.  Neck: Normal range of motion. Neck supple. No thyromegaly present.  Cardiovascular: Normal rate and regular rhythm.  Exam reveals no gallop.   Gr 2/6 systolic murmur  Pulmonary/Chest: Effort normal and breath sounds normal. No respiratory distress. She has no wheezes. She has no rales.  Musculoskeletal: She exhibits no tenderness.  Lymphadenopathy:    She has no cervical adenopathy.  Psychiatric: She has a normal mood and affect. Her behavior is normal.          Assessment & Plan:

## 2013-10-26 NOTE — Assessment & Plan Note (Signed)
BP Readings from Last 3 Encounters:  10/26/13 158/56  09/29/13 160/60  07/10/13 128/60   Still borderline Getting high readings at [facility] I am concerned she has pseudohypertension from stiff arteries---when I inflate it the pulse is gone but I hear something at a higher level (that I think is not a Karotkoff sound)  Beta blocker might be good with carcinoid--but pulse only 60 No HCTZ due to history of hyponatremia Will add low dose amlodpine

## 2013-11-02 ENCOUNTER — Telehealth: Payer: Self-pay

## 2013-11-02 NOTE — Telephone Encounter (Signed)
Marie Zeck left v/m to update pt condition; pt having pain and burning in feet; some redness in feet as well. Pt just mentioned burning and pain in feet this morning, [name] not sure when foot issue started. BP has not improved since starting amlodipine 2.5 mg. BP averaging 190's over 60. [name] may hear slight change at 140 or 150. Pt is not symptomatic, no dizziness, no fainting feeling. Marie request cb [phone].

## 2013-11-02 NOTE — Telephone Encounter (Signed)
Sunaina Ferrando, pt's daughter in law says you called and left her a mssg in ref to pt's b/p.  She says you did not address the burning in pt's feet and would like for you to c/b to advise.  She was in the middle of an apptmt is why she couldn't answer her phone. Thank you.

## 2013-11-02 NOTE — Telephone Encounter (Signed)
Message left explaining that that is what I have found as well and that this may be pseudohypertension. I asked her to palpate the artery and only pump up another 10-15mmHg before listening for the sounds

## 2013-11-02 NOTE — Telephone Encounter (Signed)
Feet have been an ongoing issue Long standing mildly elevated sugars from the carcinoid Really bothers her at night with pain Tylenol helps a little but wears off  Try arthritis tylenol Consider gabapentin if ongoing trouble sleeping with this  Also has some degree of somatization--multiple complaints Wants to see multiple doctors for many things Might want to consider SSRI

## 2013-11-17 ENCOUNTER — Ambulatory Visit: Payer: Self-pay | Admitting: Oncology

## 2013-12-04 ENCOUNTER — Telehealth: Payer: Self-pay | Admitting: Internal Medicine

## 2013-12-04 NOTE — Telephone Encounter (Signed)
Pt's daughter-in-law called and stated "just reporting in" with an update on pt's BP.  It is 210-220/68.  Otherwise she is feeling fine. No headaches or dizziness.  Best number to call [name] is [phone]

## 2013-12-07 NOTE — Telephone Encounter (Signed)
Discussed with her today Changing position of patch just in case She still has no symptoms  Due for sandostatin on 10/5 She will call Dr Oliva Bustard to let him know about the BP---in case there should be any change in this Rx due to intermittently much higher BPs

## 2013-12-21 ENCOUNTER — Ambulatory Visit: Payer: Self-pay | Admitting: Oncology

## 2014-01-07 ENCOUNTER — Ambulatory Visit (INDEPENDENT_AMBULATORY_CARE_PROVIDER_SITE_OTHER): Payer: Medicare Other | Admitting: Internal Medicine

## 2014-01-07 ENCOUNTER — Encounter: Payer: Self-pay | Admitting: Internal Medicine

## 2014-01-07 VITALS — BP 140/60 | HR 63 | Temp 98.4°F | Wt 116.0 lb

## 2014-01-07 DIAGNOSIS — D3A00 Benign carcinoid tumor of unspecified site: Secondary | ICD-10-CM

## 2014-01-07 DIAGNOSIS — I10 Essential (primary) hypertension: Secondary | ICD-10-CM

## 2014-01-07 NOTE — Assessment & Plan Note (Signed)
On monthly octeotride

## 2014-01-07 NOTE — Assessment & Plan Note (Signed)
BP Readings from Last 3 Encounters:  01/07/14 140/60  10/26/13 158/56  09/29/13 160/60   Still good here Warned her about automated cuffs (if she has pseudohypertension from stiff arteries) Feels fine No changes now

## 2014-01-07 NOTE — Progress Notes (Signed)
Subjective:    Patient ID: Sophia Hill, female    DOB: [birth year], 80 y.o.   MRN: [id number]  HPI Here with son as usual  No problems with the new medication No edema or dizziness Still has had BP elevated to 509 systolic Measured at [facility] and by DIL [name], RN Doesn't get any symptoms when it is high Has changed the clonidine patch to spots with more "meat"  Concern that carcinoid is causing these high readings No sweats or palpitations Does get occasional "blow out" but no persistent diarrhea. (tends to be after sandostatin injection)  Current Outpatient Prescriptions on File Prior to Visit  Medication Sig Dispense Refill  . amLODipine (NORVASC) 2.5 MG tablet Take 1 tablet (2.5 mg total) by mouth daily.  30 tablet  11  . aspirin 81 MG tablet Take 81 mg by mouth daily.      . Cholecalciferol (VITAMIN D) 2000 UNITS CAPS Take by mouth daily.      . cloNIDine (CATAPRES - DOSED IN MG/24 HR) 0.2 mg/24hr patch Place 1 patch (0.2 mg total) onto the skin once a week.  4 patch  12  . docusate sodium (STOOL SOFTENER) 100 MG capsule Take 100 mg by mouth as needed.      Marland Kitchen lisinopril (PRINIVIL,ZESTRIL) 20 MG tablet Take 1 tablet (20 mg total) by mouth daily.  90 tablet  1  . Multiple Vitamins-Minerals (CENTRUM SILVER ADULT 50+) TABS Take by mouth daily.      Marland Kitchen octreotide (SANDOSTATIN LAR DEPOT) 10 MG injection Inject 10 mg into the muscle every 28 (twenty-eight) days. Not sure of dose       No current facility-administered medications on file prior to visit.    Allergies  Allergen Reactions  . Penicillin G     Other reaction(s): Unknown  . Augmentin [Amoxicillin-Pot Clavulanate] Rash    Past Medical History  Diagnosis Date  . Hyperlipidemia   . Hypertension   . Carcinoid tumor 2005    [facility]  . Depression   . Late effects of CVA (cerebrovascular accident)   . Osteoarthritis, multiple sites   . Anemia     iron deficiency in the  past  . Type II or unspecified type diabetes mellitus without mention of complication, not stated as uncontrolled     Past Surgical History  Procedure Laterality Date  . Tonsillectomy    . Colon resection  2005    for carcinoid  . Hemorrhoid surgery      Family History  Problem Relation Age of Onset  . Stroke Mother   . Hypertension Mother   . Cancer Father   . Heart disease Father   . Diabetes Brother     History   Social History  . Marital Status: Widowed    Spouse Name: N/A    Number of Children: 2  . Years of Education: N/A   Occupational History  . Retired-- [employer] History Main Topics  . Smoking status: Never Smoker   . Smokeless tobacco: Never Used  . Alcohol Use: No  . Drug Use: No  . Sexual Activity: Not on file   Other Topics Concern  . Not on file   Social History Narrative   Has living will   Son is health care POA   Requests DNR--done 11/02/11   No tube feeds if cognitively unaware    Review of Systems Has some right ankle problems--gets red  at times. She uses a cream on it. Uses acetaminophen arthritis bid--this seems to have helped her foot pain Appetite is great    Objective:   Physical Exam  Constitutional: She appears well-developed and well-nourished. No distress.  Neck: Normal range of motion. Neck supple. No thyromegaly present.  Cardiovascular: Normal rate and regular rhythm.  Exam reveals no gallop.   Gr 2/6 blowing systolic murmur at base  Pulmonary/Chest: Effort normal and breath sounds normal. No respiratory distress. She has no wheezes. She has no rales.  Abdominal: Soft. There is no tenderness.  Musculoskeletal: She exhibits no edema and no tenderness.  Lymphadenopathy:    She has no cervical adenopathy.  Psychiatric: She has a normal mood and affect. Her behavior is normal.          Assessment & Plan:

## 2014-01-07 NOTE — Progress Notes (Signed)
Pre visit review using our clinic review tool, if applicable. No additional management support is needed unless otherwise documented below in the visit note. 

## 2014-01-17 ENCOUNTER — Ambulatory Visit: Payer: Self-pay | Admitting: Oncology

## 2014-02-09 ENCOUNTER — Telehealth: Payer: Self-pay | Admitting: Internal Medicine

## 2014-02-09 NOTE — Telephone Encounter (Signed)
Pt daughter in law called stating pt's foot pain is getting worse and is effecting her quality of life and would like some sort of medication called in. If you need to call [name] (daughter in law) her number is [place]

## 2014-02-10 NOTE — Telephone Encounter (Signed)
Let her know that I would recommend acetaminophen 650mg  tid regularly and then okay to add tramadol 50mg  0.5-1 tab tid prn (if acetaminophen not enough) Okay to send Rx for #60 x 0)

## 2014-02-10 NOTE — Telephone Encounter (Signed)
Spoke with daughter in-law and they are already giving her 650mg  bid and she will try to bump it up to tid. DIL would like to try gabapentin or maybe amitriptyline. She does not want to do any narcotics right now, she thinks its nerve pain. She did say she might try 25mg  of tramadol, she stated her husband has a prescription and before we call in the 50mg  she wanted to see if this will work. Please advise

## 2014-02-13 NOTE — Telephone Encounter (Signed)
Okay to send Rx for gabapentin 100mg  Start at bedtime and can increase to tid as needed #90 x 1  Hold off on tramadol Rx till we see how she does with it

## 2014-02-15 MED ORDER — GABAPENTIN 100 MG PO CAPS: 100.0000 mg | ORAL_CAPSULE | Freq: Three times a day (TID) | ORAL | Status: DC | PRN

## 2014-02-15 NOTE — Telephone Encounter (Signed)
Spoke with DIL and advised results, she will call if anything changes

## 2014-02-16 ENCOUNTER — Ambulatory Visit: Payer: Self-pay | Admitting: Oncology

## 2014-02-17 ENCOUNTER — Other Ambulatory Visit: Payer: Self-pay | Admitting: *Deleted

## 2014-02-17 MED ORDER — LISINOPRIL 20 MG PO TABS: 20.0000 mg | ORAL_TABLET | Freq: Every day | ORAL | Status: DC

## 2014-02-17 NOTE — Telephone Encounter (Signed)
rx sent to pharmacy by e-script  

## 2014-02-25 ENCOUNTER — Ambulatory Visit: Payer: Medicare Other | Admitting: Internal Medicine

## 2014-02-26 ENCOUNTER — Ambulatory Visit: Payer: Medicare Other | Admitting: Internal Medicine

## 2014-03-02 ENCOUNTER — Ambulatory Visit (INDEPENDENT_AMBULATORY_CARE_PROVIDER_SITE_OTHER): Payer: Medicare Other | Admitting: Internal Medicine

## 2014-03-02 ENCOUNTER — Ambulatory Visit (INDEPENDENT_AMBULATORY_CARE_PROVIDER_SITE_OTHER)
Admission: RE | Admit: 2014-03-02 | Discharge: 2014-03-02 | Disposition: A | Discharge status: Home / Self Care | Payer: Medicare Other | Source: Ambulatory Visit | Attending: Internal Medicine | Admitting: Internal Medicine

## 2014-03-02 ENCOUNTER — Encounter: Payer: Self-pay | Admitting: Internal Medicine

## 2014-03-02 VITALS — BP 158/60 | HR 68 | Temp 97.7°F | Wt 115.0 lb

## 2014-03-02 DIAGNOSIS — M79671 Pain in right foot: Secondary | ICD-10-CM

## 2014-03-02 NOTE — Assessment & Plan Note (Addendum)
For months and fairly severe X-ray negative-- I was concerned about a possible fracture Seems mechanical --nothing to suggest gout Discussed padding and proper shoes

## 2014-03-02 NOTE — Progress Notes (Signed)
Pre visit review using our clinic review tool, if applicable. No additional management support is needed unless otherwise documented below in the visit note. 

## 2014-03-02 NOTE — Patient Instructions (Signed)
If your foot is not getting any better, please call for an appointment with Dr Lytle [place] sports medicine specialist

## 2014-03-02 NOTE — Progress Notes (Signed)
Subjective:    Patient ID: Sophia Hill, female    DOB: [birth year], 80 y.o.   MRN: [id number]  HPI Here with son for evaluation of right foot pain  Has been going on a long time--some months Not getting any better Son is worried about protruding bone between 3rd and 4th toes Taking arthritis acetaminophen without relief Now on gabapentin  Pain when she touches it  Got new shoes--but they are too big---but this is still better than the tight ones "Sore" all the time Really bad if she bumps it Can even hurt when sheet touches them in bed  Doesn't remember any injury  Current Outpatient Prescriptions on File Prior to Visit  Medication Sig Dispense Refill  . amLODipine (NORVASC) 2.5 MG tablet Take 1 tablet (2.5 mg total) by mouth daily. 30 tablet 11  . aspirin 81 MG tablet Take 81 mg by mouth daily.    . Cholecalciferol (VITAMIN D) 2000 UNITS CAPS Take by mouth daily.    . cloNIDine (CATAPRES - DOSED IN MG/24 HR) 0.2 mg/24hr patch Place 1 patch (0.2 mg total) onto the skin once a week. 4 patch 12  . docusate sodium (STOOL SOFTENER) 100 MG capsule Take 100 mg by mouth as needed.    Marland Kitchen lisinopril (PRINIVIL,ZESTRIL) 20 MG tablet Take 1 tablet (20 mg total) by mouth daily. 90 tablet 1  . Multiple Vitamins-Minerals (CENTRUM SILVER ADULT 50+) TABS Take by mouth daily.    Marland Kitchen octreotide (SANDOSTATIN LAR DEPOT) 10 MG injection Inject 10 mg into the muscle every 28 (twenty-eight) days. Not sure of dose     No current facility-administered medications on file prior to visit.    Allergies  Allergen Reactions  . Penicillin G     Other reaction(s): Unknown  . Augmentin [Amoxicillin-Pot Clavulanate] Rash    Past Medical History  Diagnosis Date  . Hyperlipidemia   . Hypertension   . Carcinoid tumor 2005    [facility]  . Depression   . Late effects of CVA (cerebrovascular accident)   . Osteoarthritis, multiple sites   . Anemia     iron deficiency  in the past  . Type II or unspecified type diabetes mellitus without mention of complication, not stated as uncontrolled     Past Surgical History  Procedure Laterality Date  . Tonsillectomy    . Colon resection  2005    for carcinoid  . Hemorrhoid surgery      Family History  Problem Relation Age of Onset  . Stroke Mother   . Hypertension Mother   . Cancer Father   . Heart disease Father   . Diabetes Brother     History   Social History  . Marital Status: Widowed    Spouse Name: N/A    Number of Children: 2  . Years of Education: N/A   Occupational History  . Retired-- [employer] History Main Topics  . Smoking status: Never Smoker   . Smokeless tobacco: Never Used  . Alcohol Use: No  . Drug Use: No  . Sexual Activity: Not on file   Other Topics Concern  . Not on file   Social History Narrative   Has living will   Son is health care POA   Requests DNR--done 11/02/11   No tube feeds if cognitively unaware   Review of Systems No open areas in feet No rash on foot    Objective:  Physical Exam  Constitutional: She appears well-developed and well-nourished. No distress.  Musculoskeletal:  Right foot without edema No swelling or pain at ankle Tenderness over 4th MTP joint--the metatarsals themselves are quiet Very painful with manipulation of 4th toe Everything else non tender No ulcers or other visible lesions          Assessment & Plan:

## 2014-03-19 ENCOUNTER — Ambulatory Visit: Payer: Self-pay | Admitting: Oncology

## 2014-04-12 LAB — COMPREHENSIVE METABOLIC PANEL
ALT: 22 U/L
ANION GAP: 8 (ref 7–16)
AST: 15 U/L (ref 15–37)
Albumin: 2.9 g/dL — ABNORMAL LOW (ref 3.4–5.0)
Alkaline Phosphatase: 78 U/L
BILIRUBIN TOTAL: 0.3 mg/dL (ref 0.2–1.0)
BUN: 15 mg/dL (ref 7–18)
Calcium, Total: 8.7 mg/dL (ref 8.5–10.1)
Chloride: 100 mmol/L (ref 98–107)
Co2: 31 mmol/L (ref 21–32)
Creatinine: 0.72 mg/dL (ref 0.60–1.30)
EGFR (African American): >60
EGFR (Non-African Amer.): >60
GLUCOSE: 186 mg/dL — AB (ref 65–99)
Osmolality: 283 (ref 275–301)
POTASSIUM: 3.9 mmol/L (ref 3.5–5.1)
Sodium: 139 mmol/L (ref 136–145)
TOTAL PROTEIN: 7.2 g/dL (ref 6.4–8.2)

## 2014-04-12 LAB — CBC CANCER CENTER
BASOS PCT: 0.7 %
Basophil #: 0 x10 3/mm (ref 0.0–0.1)
EOS PCT: 1.7 %
Eosinophil #: 0.1 x10 3/mm (ref 0.0–0.7)
HCT: 33.9 % — AB (ref 35.0–47.0)
HGB: 10.9 g/dL — ABNORMAL LOW (ref 12.0–16.0)
Lymphocyte #: 1.4 x10 3/mm (ref 1.0–3.6)
Lymphocyte %: 19.7 %
MCH: 28.1 pg (ref 26.0–34.0)
MCHC: 32.2 g/dL (ref 32.0–36.0)
MCV: 87 fL (ref 80–100)
Monocyte #: 0.7 x10 3/mm (ref 0.2–0.9)
Monocyte %: 9.7 %
NEUTROS PCT: 68.2 %
Neutrophil #: 4.7 x10 3/mm (ref 1.4–6.5)
PLATELETS: 252 x10 3/mm (ref 150–440)
RBC: 3.89 10*6/uL (ref 3.80–5.20)
RDW: 14.1 % (ref 11.5–14.5)
WBC: 6.9 x10 3/mm (ref 3.6–11.0)

## 2014-04-19 ENCOUNTER — Ambulatory Visit: Payer: Self-pay | Admitting: Oncology

## 2014-05-05 IMAGING — CR DG CHEST 2V
1 series · 3 of 3 positions shown · non-contrast
Comparison: None

CLINICAL DATA: Weakness, hyponatremia

EXAM:
CHEST  2 VIEW

[Series 1: pa · 0.17mm/px · 3 of 3 slices shown]
[im 1/3]
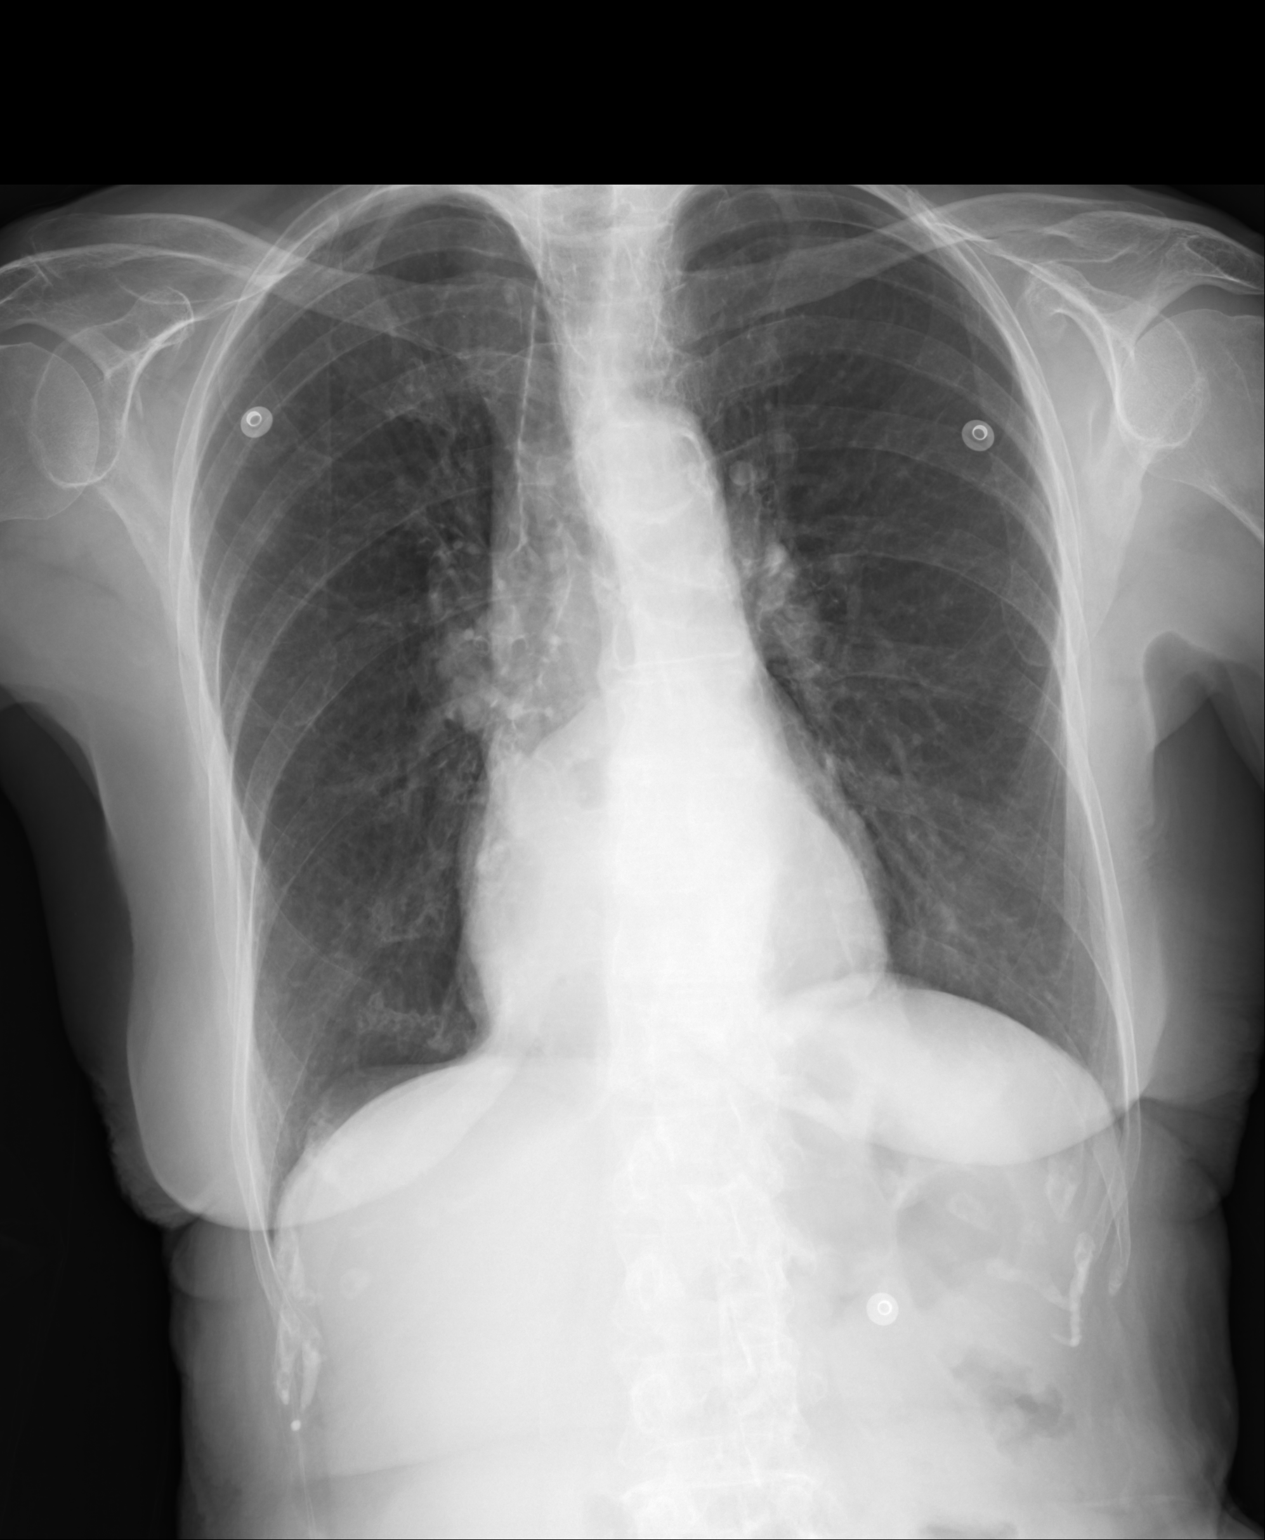
[im 2/3]
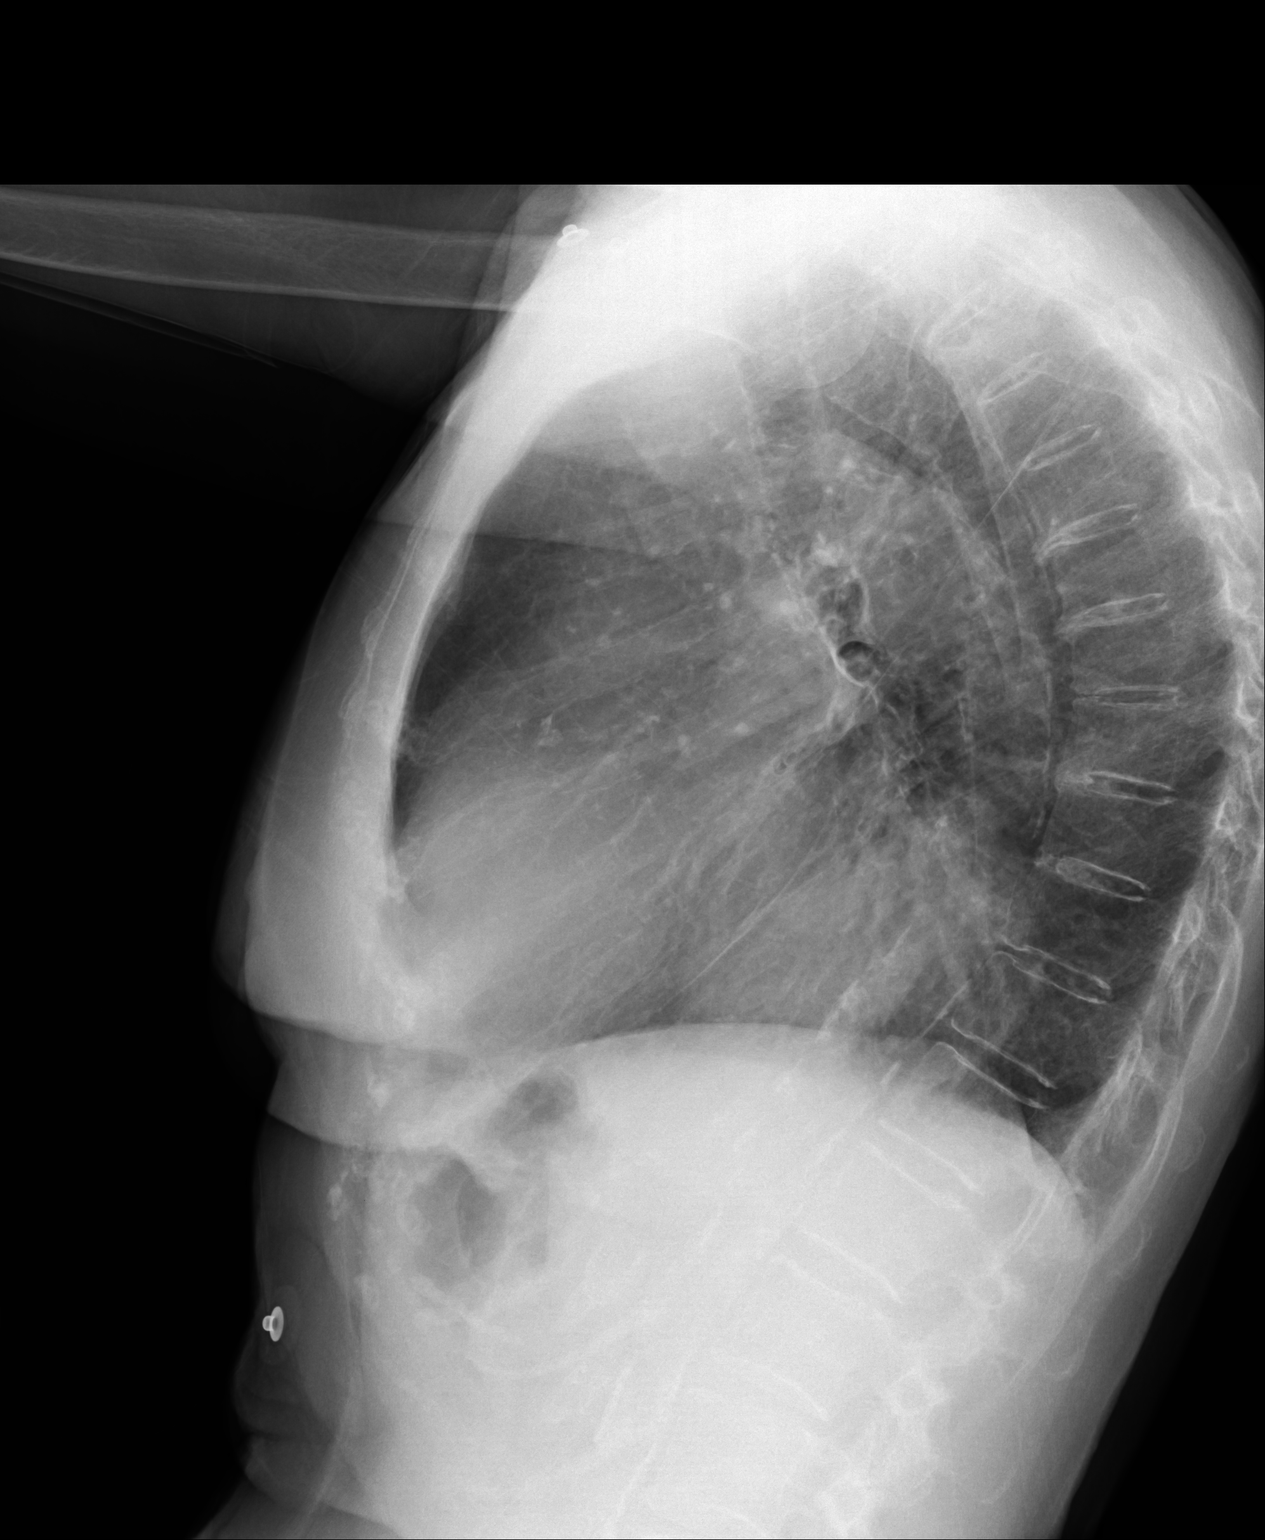
[im 3/3]
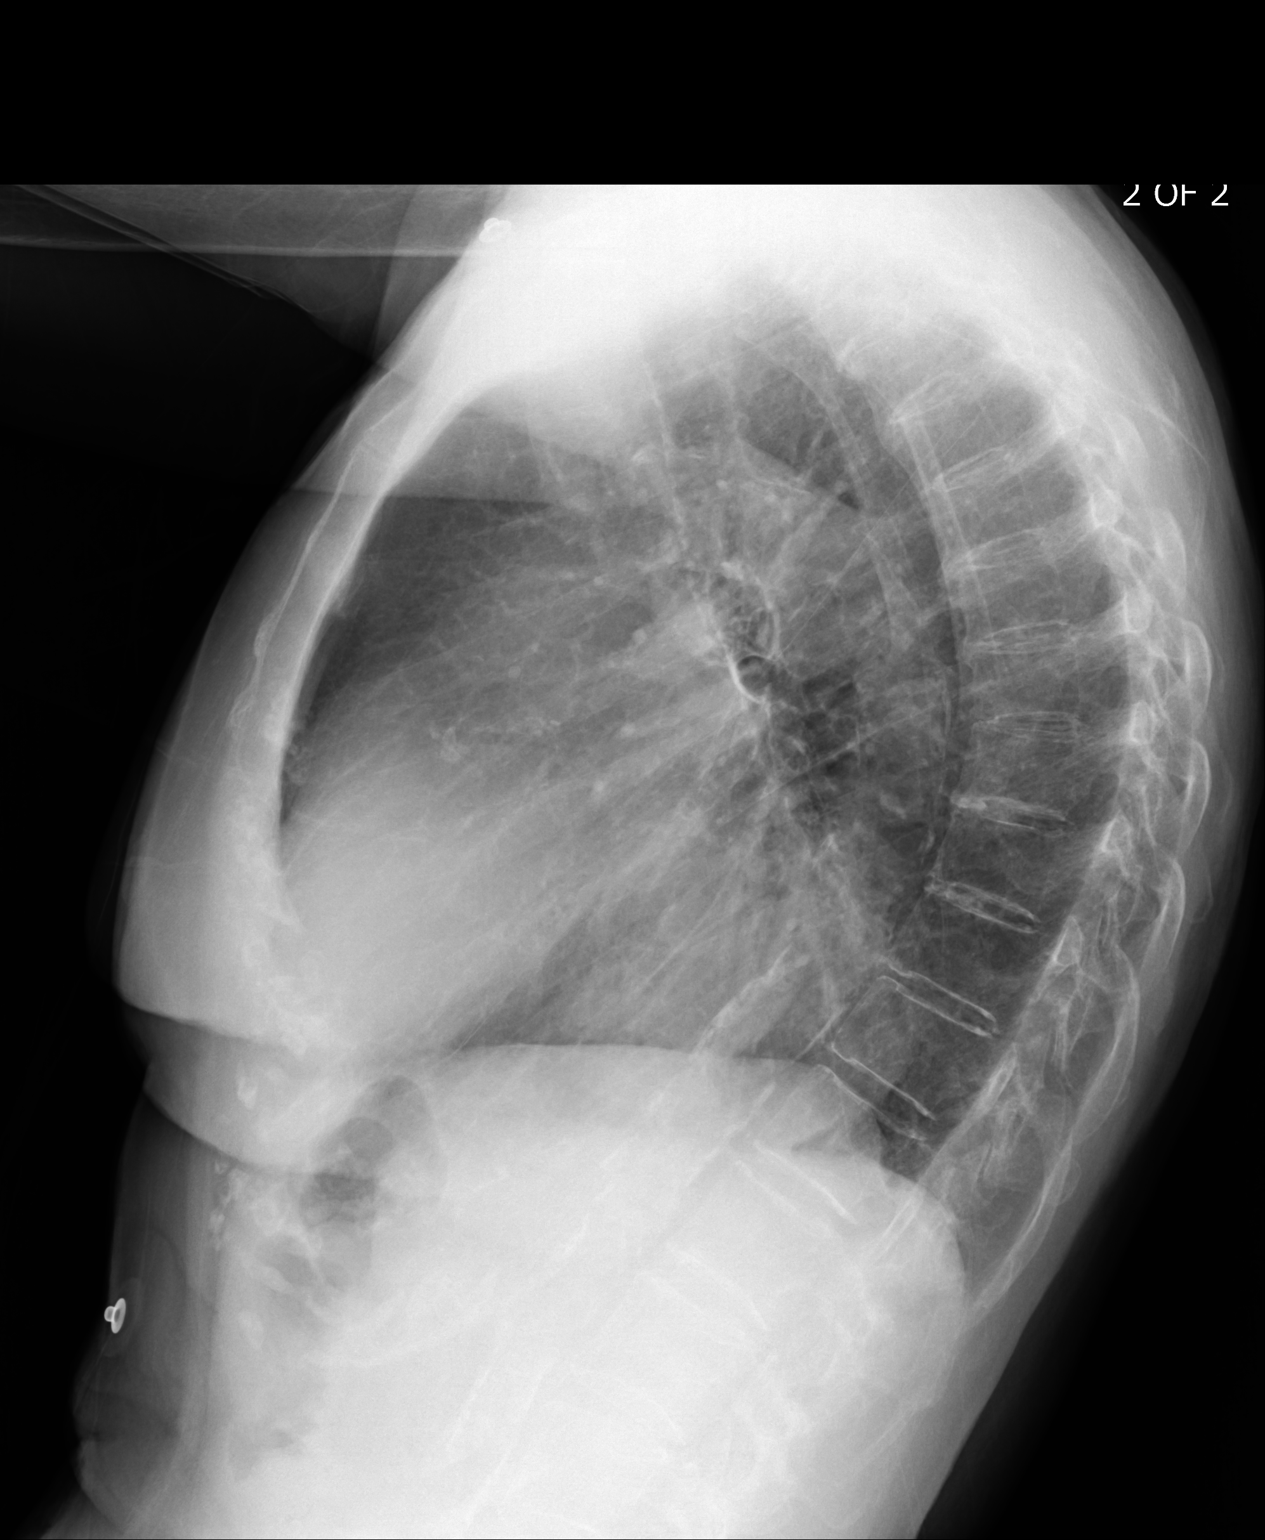

[3 of 3 positions shown; findings below may reference images not displayed]

FINDINGS: Large hiatal hernia.

Normal heart size, mediastinal contours and pulmonary vascularity
otherwise seen.

Extensive atherosclerotic calcification aorta.

Emphysematous and bronchitic changes consistent with COPD.

No acute infiltrate, pleural effusion or pneumothorax.

Diffuse osseous demineralization.
IMPRESSION: Large hiatal hernia.

COPD changes.

No acute abnormalities.

## 2014-05-18 ENCOUNTER — Ambulatory Visit
Admit: 2014-05-18 | Disposition: A | Discharge status: Home / Self Care | Payer: Self-pay | Attending: Oncology | Admitting: Oncology

## 2014-06-18 ENCOUNTER — Ambulatory Visit
Admit: 2014-06-18 | Disposition: A | Discharge status: Home / Self Care | Payer: Self-pay | Attending: Oncology | Admitting: Oncology

## 2014-06-21 LAB — HM DIABETES EYE EXAM

## 2014-07-05 ENCOUNTER — Other Ambulatory Visit: Payer: Self-pay | Admitting: *Deleted

## 2014-07-05 MED ORDER — CLONIDINE HCL 0.2 MG/24HR TD PTWK: 0.2000 mg | MEDICATED_PATCH | TRANSDERMAL | Status: DC

## 2014-07-05 MED ORDER — LISINOPRIL 20 MG PO TABS: 20.0000 mg | ORAL_TABLET | Freq: Every day | ORAL | Status: DC

## 2014-07-05 MED ORDER — AMLODIPINE BESYLATE 2.5 MG PO TABS: 2.5000 mg | ORAL_TABLET | Freq: Every day | ORAL | Status: DC

## 2014-07-09 NOTE — Op Note (Signed)
PATIENT NAME:  Sophia Hill, PIERATT MR#:  [id number] DATE OF BIRTH:  1922-02-04  DATE OF PROCEDURE:  07/28/2012  PREOPERATIVE DIAGNOSIS: Cataract, left eye.   POSTOPERATIVE DIAGNOSIS: Cataract, left eye.  PROCEDURE PERFORMED: Extracapsular cataract extraction using phacoemulsification with placement of Alcon [id number], 18.5-diopter posterior chamber lens, serial number [id number].   SURGEON: Loura Back. Eloyce Bultman, M.D.   ANESTHESIA: 4% lidocaine and 0.75% Marcaine in a 50-50 mixture with 10 units/mL of HyoMax added, given as a peribulbar.   ANESTHESIOLOGIST: Dr. Ronelle Nigh.   COMPLICATIONS: None.   ESTIMATED BLOOD LOSS: Less than 1 mL.   PREOPERATIVE DIAGNOSIS:  Cataract, left eye.    POSTOPERATIVE DIAGNOSIS:  Cataract, left eye.  DESCRIPTION OF PROCEDURE:  The patient was brought to the operating room and given a peribulbar block.  The patient was then prepped and draped in the usual fashion.  The vertical rectus muscles were imbricated using 5-0 silk sutures.  These sutures were then clamped to the sterile drapes as bridle sutures.  A limbal peritomy was performed extending two clock hours and hemostasis was obtained with cautery.  A partial thickness scleral groove was made at the surgical limbus and dissected anteriorly in a lamellar dissection using an Alcon crescent knife.  The anterior chamber was entered supero-temporally with a Superblade and through the lamellar dissection with a 2.6 mm keratome.  DisCoVisc was used to replace the aqueous and a continuous tear capsulorrhexis was carried out.  Hydrodissection and hydrodelineation were carried out with balanced salt and a 27 gauge canula.  The nucleus was rotated to confirm the effectiveness of the hydrodissection.  Phacoemulsification was carried out using a divide-and-conquer technique.  Total ultrasound time was 1 minute and 56 seconds with an average power of  26.5%. CDE 49.46.  Irrigation/aspiration was used to remove the residual  cortex.  DisCoVisc was used to inflate the capsule and the internal incision was enlarged to 3 mm with the crescent knife.  The intraocular lens was folded and inserted into the capsular bag using the AcrySert delivery system.  Irrigation/aspiration was used to remove the residual DisCoVisc.  Miostat was injected into the anterior chamber through the paracentesis track to inflate the anterior chamber and induce miosis.  The wound was checked for leaks and none were found. The conjunctiva was closed with cautery and the bridle sutures were removed.  Two drops of 0.3% Vigamox were placed on the eye.   An eye shield was placed on the eye.  The patient was discharged to the recovery room in good condition.   ____________________________ Loura Back Jerral Mccauley, MD sad:aw D: 07/28/2012 14:42:32 ET T: 07/29/2012 07:10:51 ET JOB#: [id number]  cc: Remo Lipps A. Kiyani Jernigan, MD, <Dictator> Martie Lee MD ELECTRONICALLY SIGNED 07/30/2012 11:53

## 2014-07-10 NOTE — H&P (Signed)
PATIENT NAME:  Sophia Hill, Sophia Hill MR#:  [id number] DATE OF BIRTH:  02-Jan-1922  DATE OF ADMISSION:  05/09/2013  PRIMARY CARE PHYSICIAN: Dr. Silvio Pate.   CHIEF COMPLAINT: Weakness.   HISTORY OF PRESENT ILLNESS: This is an 81 year old female with history of metastatic carcinoid tumor that has been maintained with Sandostatin LAR injections once a month. She presents with all of a sudden weakness. Last night, she could not get into her bed and then finally got into her bed and then this morning could not get out of her bed. She did have a recent urinary tract infection with 3 days of antibiotics. The family cannot recall which antibiotic it was but with her ALLERGY TO PENICILLIN, it was likely Cipro. She was told if it did not get better, she should have a culture.  She was unable to get back for her culture. She did have more symptoms with respect to burning.  She went to get some testing and was found to have a low sodium and was sent over to the ER for further evaluation. In the ER, she was found to have a sodium of 127 and hospitalist services were contacted for further evaluation.  She states that she has been eating okay. She just has symptoms with burning and with weakness.   PAST MEDICAL HISTORY: Metastatic carcinoid tumor treated with Sandostatin LAR, diabetes, CVA with right-sided weakness, hypertension, right arm fracture that was never operated on.   PAST SURGICAL HISTORY: Carcinoid tumor, tonsillectomy, left cataract.   ALLERGIES: PENICILLIN AND AUGMENTIN.   MEDICATIONS: Include aspirin 81 mg daily, Centrum Silver 1 tablet daily, clonidine patch 0.1 mg weekly patch, lisinopril HCT 20/25 one tablet daily, Colace 100 mg daily, vitamin D 2000 international units daily, Sandostatin LAR injection once a month.   SOCIAL HISTORY: No smoking. No alcohol. No drug use. Lives at [facility] independent [facility].   FAMILY HISTORY: Father died of pancreatic cancer. Mother died of a CVA at age 86. Brother  with diabetes.   REVIEW OF SYSTEMS:  CONSTITUTIONAL: No fever, chills, or sweats. Positive for weakness.  EYES: She does wear glasses and has a cataract.  EARS, NOSE, MOUTH AND THROAT: Positive for postnasal drip and scratchy throat. No sore throat. No difficulty swallowing.  CARDIOVASCULAR: No chest pain. No palpitations.  RESPIRATORY: No shortness of breath. No cough. No sputum. No hemoptysis.  GASTROINTESTINAL: No nausea. No vomiting. No abdominal pain. No diarrhea. No constipation. No bright red blood per rectum. No melena.  GENITOURINARY: No burning on urination. No hematuria.  MUSCULOSKELETAL: Occasional right arm pain.  INTEGUMENT: No rashes or eruptions.  NEUROLOGIC: No fainting or blackouts.  PSYCHIATRIC: No anxiety or depression.  ENDOCRINE: No thyroid problems.  HEMATOLOGIC AND LYMPHATIC: No anemia now. History of anemia in the past.   PHYSICAL EXAMINATION: VITAL SIGNS: Temperature 98.3, pulse 63, respirations 18, blood pressure 155/69, pulse oximetry 98% on room air.  GENERAL: No respiratory distress.  EYES: Conjunctivae and lids normal. Pupils equal, round, and reactive to light. Extraocular muscles intact. No nystagmus.  EARS, NOSE, MOUTH AND THROAT: Tympanic membranes: No erythema. Nasal mucosa: No erythema. Throat: No erythema, no exudate seen. Lips and gums: No lesions.  NECK: No JVD. No bruits. No lymphadenopathy. No thyromegaly. No thyroid nodules palpated.  RESPIRATORY:  Lungs clear to auscultation. No use of accessory muscles to breathe. No rhonchi, rales, or wheeze heard.  CARDIOVASCULAR: S1, S2 normal. Positive 2 out of 6 systolic ejection murmur. Carotid upstroke 2+ bilaterally. No bruits.  EXTREMITIES:  Dorsalis pedis pulses 2+ bilaterally. Trace edema of the lower extremity.  ABDOMEN: Soft, nontender. No organomegaly/splenomegaly. Normoactive bowel sounds. No masses felt.  LYMPHATIC: No lymph nodes in the neck.  MUSCULOSKELETAL: Trace edema. No clubbing. No  cyanosis.  SKIN: No rashes or ulcers seen.  NEUROLOGIC: Cranial nerves II through XII grossly intact. Deep tendon reflexes 1+ bilateral lower extremity. Power 5/5 upper and lower extremities. Difficult to test right upper extremities with recent fracture.  PSYCHIATRIC: The patient is oriented to person, place, and time.   LABORATORY AND RADIOLOGICAL DATA:  EKG: Normal sinus rhythm at 63 beats per minute, no acute ST-T wave changes. Chest x-ray shows large hiatal hernia. White blood cell count 7.3, hemoglobin and hematocrit 11.9 and 35.2, platelet count 162.  Glucose 144, BUN 15, creatinine 0.97, sodium 127, potassium 4.3, chloride 94, CO2 of 32, calcium 9.3. Urinalysis negative nitrites and leukocyte esterase.   ASSESSMENT AND PLAN: 1.  Hyponatremia with weakness. Will give gentle IV fluid hydration. We will hold the patient's hydrochlorothiazide. Send off a urine random sodium. Will get physical therapy evaluation to make sure the patient is strong enough to go back to independent living. Put on a fluid restriction.  2.  Recent urinary tract infection. We will hold off on antibiotics at this point since, nitrites and leukocyte esterase is negative. Follow up on the urine culture.  3.  Hypertension. Continue clonidine patch and lisinopril 20 mg daily.  4.  Diabetes. Not on any medications for this. We will monitor sugar daily.  5.  Metastatic carcinoid tumor maintained on Sandostatin LAR on a monthly basis injection   TIME SPENT ON ADMISSION: 55 minutes.   CODE STATUS: The patient is a DO NOT RESUSCITATE.    ____________________________ Tana Conch. Leslye Peer, MD rjw:dp D: 05/09/2013 15:20:10 ET T: 05/09/2013 15:48:41 ET JOB#: [id number]  cc: Tana Conch. Leslye Peer, MD, <Dictator> Venia Carbon, MD Marisue Brooklyn MD ELECTRONICALLY SIGNED 05/10/2013 16:09

## 2014-07-10 NOTE — Discharge Summary (Signed)
PATIENT NAME:  Sophia Hill, Sophia Hill MR#:  [id number] DATE OF BIRTH:  [birth year]  DATE OF ADMISSION:  05/09/2013 DATE OF DISCHARGE:  05/11/2013  PRIMARY DOCTOR: Venia Carbon, MD  DISCHARGE DIAGNOSES: Generalized weakness secondary to hyponatremia. The patient has other problems of hypertension and history of carcinoid tumor. The patient follows with Dr. Oliva Bustard.    DISCHARGE MEDICATIONS:  1. Aspirin 81 mg p.o. daily.  2. Clonidine 0.1 mg patch transdermal every week.  3. Colace 100 mg p.o. daily as needed for constipation.  4. Vitamin D3 2000 units daily.  5. Lisinopril 20 mg p.o. daily.  STOP THE FOLLOWING MEDICATIONS:  1. Hydrochlorothiazide/lisinopril is stopped and changed to plain lisinopril.  2. The patient advised to stop Bactrim as well.   HOSPITAL COURSE: An 81 year old female patient who came in because of weakness. The patient has been treated with Bactrim recently for her urine infections, and the patient had generalized weakness and she could not get out of her bed on the 20th night, so she was admitted from the 21st. The patient was found to have sodium of 127 on admission. A month ago, her sodium was above 130. The patient does have a history of carcinoid tumor with Sandostatin every month, and also she has a history of diabetes and hypertension and right arm fracture. The patient's labs were showing: EKG showed normal sinus rhythm. A chest x-ray showed hiatal hernia. The patient's WBC was normal, hemoglobin was 11.9. The patient's UA was negative for nitrites and leukocyte esterase. Sodium was 127, so she was admitted for hyponatremia. Her hydrochlorothiazide was stopped, and the patient received IV fluids, and the patient's sodium improved nicely with IV fluids, and the patient received normal saline initially 125 mL, then tapered down to 75 mL, and the patient's sodium improved to 133 at the discharge time. Advised her to continue on lisinopril and avoid hydrochlorothiazide  combination. The patient had recently treated UTI, and the patient's urine culture showed mixed bacteria, suggestive of contamination. Her UA showed bacteria of 3+, but WBCs only 2, so I advised her to stop the Bactrim. The patient, I think, she finished 3 days of Bactrim as an outpatient. The patient also seen by physical therapy, and they recommended home with front-wheeled walker. The patient does have a front-wheeled walker already, so she went home in stable condition. The patient's blood pressure on discharge 148/62, then it was 164/60, temperature 97.8, heart rate 70, saturation is 93% on room air.   TIME SPENT: More than 30 minutes.   ____________________________ Epifanio Lesches, MD sk:lb D: 05/13/2013 11:18:34 ET T: 05/13/2013 11:38:51 ET JOB#: [id number]  cc: Epifanio Lesches, MD, <Dictator> Venia Carbon, MD Epifanio Lesches MD ELECTRONICALLY SIGNED 05/14/2013 14:11

## 2014-07-11 ENCOUNTER — Emergency Department
Admit: 2014-07-11 | Disposition: A | Discharge status: Home / Self Care | Payer: Self-pay | Admitting: Emergency Medicine

## 2014-07-11 LAB — COMPREHENSIVE METABOLIC PANEL
ANION GAP: 12 (ref 7–16)
AST: 52 U/L — AB
Albumin: 3.4 g/dL — ABNORMAL LOW
Alkaline Phosphatase: 92 U/L
BUN: 19 mg/dL
Bilirubin,Total: 0.4 mg/dL
CREATININE: 0.61 mg/dL
Calcium, Total: 9.2 mg/dL
Chloride: 96 mmol/L — ABNORMAL LOW
Co2: 27 mmol/L
Glucose: 222 mg/dL — ABNORMAL HIGH
Potassium: 4.6 mmol/L
SGPT (ALT): 48 U/L
Sodium: 135 mmol/L
Total Protein: 7.4 g/dL

## 2014-07-11 LAB — TROPONIN I: Troponin-I: <0.03 ng/mL

## 2014-07-11 LAB — CBC WITH DIFFERENTIAL/PLATELET
BASOS ABS: 0 10*3/uL (ref 0.0–0.1)
BASOS PCT: 0.3 %
EOS ABS: 0 10*3/uL (ref 0.0–0.7)
Eosinophil %: 0 %
HCT: 38.4 % (ref 35.0–47.0)
HGB: 12.4 g/dL (ref 12.0–16.0)
Lymphocyte #: 1.3 10*3/uL (ref 1.0–3.6)
Lymphocyte %: 8.6 %
MCH: 26.9 pg (ref 26.0–34.0)
MCHC: 32.2 g/dL (ref 32.0–36.0)
MCV: 84 fL (ref 80–100)
Monocyte #: 0.5 x10 3/mm (ref 0.2–0.9)
Monocyte %: 3.2 %
NEUTROS ABS: 12.9 10*3/uL — AB (ref 1.4–6.5)
Neutrophil %: 87.9 %
Platelet: 273 10*3/uL (ref 150–440)
RBC: 4.6 10*6/uL (ref 3.80–5.20)
RDW: 16.9 % — AB (ref 11.5–14.5)
WBC: 14.7 10*3/uL — ABNORMAL HIGH (ref 3.6–11.0)

## 2014-07-11 LAB — LIPASE, BLOOD: LIPASE: 30 U/L

## 2014-07-12 LAB — URINALYSIS, COMPLETE
Bilirubin,UR: NEGATIVE
Blood: NEGATIVE
GLUCOSE, UR: NEGATIVE mg/dL (ref 0–75)
Ketone: NEGATIVE
LEUKOCYTE ESTERASE: NEGATIVE
NITRITE: NEGATIVE
Ph: 5 (ref 4.5–8.0)
Specific Gravity: 1.037 (ref 1.003–1.030)

## 2014-07-12 LAB — LACTIC ACID, PLASMA: LACTIC ACID, VENOUS: 1.5 mmol/L

## 2014-07-19 ENCOUNTER — Encounter: Payer: Self-pay | Admitting: Internal Medicine

## 2014-07-19 ENCOUNTER — Ambulatory Visit (INDEPENDENT_AMBULATORY_CARE_PROVIDER_SITE_OTHER): Payer: Medicare Other | Admitting: Internal Medicine

## 2014-07-19 VITALS — BP 160/60 | HR 60 | Temp 97.5°F | Wt 114.0 lb

## 2014-07-19 DIAGNOSIS — Z23 Encounter for immunization: Secondary | ICD-10-CM

## 2014-07-19 DIAGNOSIS — Z0000 Encounter for general adult medical examination without abnormal findings: Secondary | ICD-10-CM | POA: Diagnosis not present

## 2014-07-19 DIAGNOSIS — Z7189 Other specified counseling: Secondary | ICD-10-CM | POA: Insufficient documentation

## 2014-07-19 DIAGNOSIS — D3A00 Benign carcinoid tumor of unspecified site: Secondary | ICD-10-CM

## 2014-07-19 DIAGNOSIS — I152 Hypertension secondary to endocrine disorders: Secondary | ICD-10-CM

## 2014-07-19 DIAGNOSIS — F39 Unspecified mood [affective] disorder: Secondary | ICD-10-CM

## 2014-07-19 DIAGNOSIS — I6990 Unspecified sequelae of unspecified cerebrovascular disease: Secondary | ICD-10-CM

## 2014-07-19 DIAGNOSIS — E119 Type 2 diabetes mellitus without complications: Secondary | ICD-10-CM | POA: Diagnosis not present

## 2014-07-19 LAB — HM DIABETES FOOT EXAM

## 2014-07-19 NOTE — Assessment & Plan Note (Signed)
Very labile due to the carcinoid 160 is okay for her

## 2014-07-19 NOTE — Assessment & Plan Note (Signed)
Mostly when she has fatigue or not feeling well Medications not indicated

## 2014-07-19 NOTE — Progress Notes (Signed)
Pre visit review using our clinic review tool, if applicable. No additional management support is needed unless otherwise documented below in the visit note. 

## 2014-07-19 NOTE — Assessment & Plan Note (Signed)
See social history °Has DNR °

## 2014-07-19 NOTE — Assessment & Plan Note (Signed)
Recent CT scan shows ongoing tumor burden Sees Dr Oliva Bustard for regular Rx Discussed rehydration when she has her "blow out" stools

## 2014-07-19 NOTE — Assessment & Plan Note (Signed)
Hasn't been checking  Hopefully still controlled Will check A1c and lipid

## 2014-07-19 NOTE — Addendum Note (Signed)
Addended by: Despina Hidden on: 07/19/2014 05:06 PM   Modules accepted: Orders

## 2014-07-19 NOTE — Patient Instructions (Signed)
Please try pedialyte to rehydrate when you have one of your blowouts.

## 2014-07-19 NOTE — Assessment & Plan Note (Signed)
Leg weakness and worsening cognition No Rx other than aspirin

## 2014-07-19 NOTE — Progress Notes (Signed)
Subjective:    Patient ID: Sophia Hill, female    DOB: [birth year], 81 y.o.   MRN: [id number]  HPI Here for Medicare wellness and follow up of chronic medical conditions Reviewed form and advanced directives Reviewed other physicians No falls in past year No alcohol or tobacco Not able to exercise Needs help with bathing and dressing. Doesn't do instrumental ADLs--has daily aide. Doesn't drive. Son sets up meds Vision and hearing are okay Mild cognitive problems from the stroke  Started having severe abdominal pain 4/23 Couldn't sleep Pain with sweats Unable to move her bowels Son took her to ER last Sunday---labs and CT scan reviewed No Rx and finally went home many hours later Did have a "massive blowout" before leaving--perhaps from the oral contrast Took a few days to get over this Did have another blow out a few days later and then again last night Fecal incontinence when this happens Still on monthly sandostatin Things seem to have settled down some though--no more pain  Not checking sugars No hypoglycemic reactions No numbness or ulcers in feet. Some soreness now better with oversized shoes  Still has some gait problems Slower lately All goes back to prior stroke Uses rollator Now forgetting things more as well  Blood pressure has been okay Not checking at home due to getting her anxious Usually 563 systolic is good for her No headaches No chest pain No SOB No edema  Feels down when she is weak Gets spells like this a couple times a week Not really anhedonic No sig anxiety  Current Outpatient Prescriptions on File Prior to Visit  Medication Sig Dispense Refill  . acetaminophen (TYLENOL) 650 MG CR tablet Take 650 mg by mouth 3 (three) times daily as needed.    Marland Kitchen amLODipine (NORVASC) 2.5 MG tablet Take 1 tablet (2.5 mg total) by mouth daily. 90 tablet 3  . aspirin 81 MG tablet Take 81 mg by mouth daily.    . Cholecalciferol (VITAMIN D) 2000 UNITS  CAPS Take by mouth daily.    . cloNIDine (CATAPRES - DOSED IN MG/24 HR) 0.2 mg/24hr patch Place 1 patch (0.2 mg total) onto the skin once a week. 12 patch 3  . lisinopril (PRINIVIL,ZESTRIL) 20 MG tablet Take 1 tablet (20 mg total) by mouth daily. 90 tablet 3  . Multiple Vitamins-Minerals (CENTRUM SILVER ADULT 50+) TABS Take by mouth daily.    Marland Kitchen octreotide (SANDOSTATIN LAR DEPOT) 10 MG injection Inject 10 mg into the muscle every 28 (twenty-eight) days. Not sure of dose     No current facility-administered medications on file prior to visit.    Allergies  Allergen Reactions  . Penicillin G     Other reaction(s): Unknown  . Augmentin [Amoxicillin-Pot Clavulanate] Rash    Past Medical History  Diagnosis Date  . Hyperlipidemia   . Hypertension   . Carcinoid tumor 2005    [facility]  . Depression   . Late effects of CVA (cerebrovascular accident)   . Osteoarthritis, multiple sites   . Anemia     iron deficiency in the past  . Type II or unspecified type diabetes mellitus without mention of complication, not stated as uncontrolled     Past Surgical History  Procedure Laterality Date  . Tonsillectomy    . Colon resection  2005    for carcinoid  . Hemorrhoid surgery      Family History  Problem Relation Age of Onset  . Stroke Mother   .  Hypertension Mother   . Cancer Father   . Heart disease Father   . Diabetes Brother     History   Social History  . Marital Status: Widowed    Spouse Name: N/A  . Number of Children: 2  . Years of Education: N/A   Occupational History  . Retired-- [place] History Main Topics  . Smoking status: Never Smoker   . Smokeless tobacco: Never Used  . Alcohol Use: No  . Drug Use: No  . Sexual Activity: Not on file   Other Topics Concern  . Not on file   Social History Narrative   Has living will   Son is health care POA   Requests DNR--done 11/02/11   No tube feeds if  cognitively unaware   Review of Systems Sleeping okay Appetite is good--but weight down another 1# Wears seat belt No recent arthritis problems Teeth okay---keeps up with dentist    Objective:   Physical Exam  Constitutional: She appears well-developed and well-nourished. No distress.  HENT:  Mouth/Throat: Oropharynx is clear and moist. No oropharyngeal exudate.  Neck: Normal range of motion. Neck supple. No thyromegaly present.  Cardiovascular: Normal rate, regular rhythm and normal heart sounds.  Exam reveals no gallop.   No murmur heard. Faint pedal pulses  Pulmonary/Chest: Effort normal and breath sounds normal. No respiratory distress. She has no wheezes. She has no rales.  Abdominal: Soft. There is no tenderness.  Musculoskeletal: She exhibits no edema or tenderness.  Lymphadenopathy:    She has no cervical adenopathy.  Neurological: She is alert.  Knew May but took a while, 2016 President-- "Obama, ?" [phone] (no 62) D-l-r-o-w Recall 0/3  Normal sensation in plantar feet  Skin: No rash noted. No erythema.  No foot lesions  Psychiatric: Her behavior is normal.  Mild psychomotor retardation          Assessment & Plan:

## 2014-07-19 NOTE — Assessment & Plan Note (Signed)
I have personally reviewed the Medicare Annual Wellness questionnaire and have noted 1. The patient's medical and social history 2. Their use of alcohol, tobacco or illicit drugs 3. Their current medications and supplements 4. The patient's functional ability including ADL's, fall risks, home safety risks and hearing or visual             impairment. 5. Diet and physical activities 6. Evidence for depression or mood disorders  The patients weight, height, BMI and visual acuity have been recorded in the chart I have made referrals, counseling and provided education to the patient based review of the above and I have provided the pt with a written personalized care plan for preventive services.  I have provided you with a copy of your personalized plan for preventive services. Please take the time to review along with your updated medication list.  No cancer screening due to age prevnar today Discussed Td---will defer

## 2014-07-20 ENCOUNTER — Telehealth: Payer: Self-pay | Admitting: Internal Medicine

## 2014-07-20 LAB — LIPID PANEL
Cholesterol: 201 mg/dL — ABNORMAL HIGH (ref 0–200)
HDL: 46.4 mg/dL (ref >39.00)
LDL Cholesterol: 120 mg/dL — ABNORMAL HIGH (ref 0–99)
NonHDL: 154.6
Total CHOL/HDL Ratio: 4
Triglycerides: 175 mg/dL — ABNORMAL HIGH (ref 0.0–149.0)
VLDL: 35 mg/dL (ref 0.0–40.0)

## 2014-07-20 LAB — HEMOGLOBIN A1C: HEMOGLOBIN A1C: 7.7 % — AB (ref 4.6–6.5)

## 2014-07-22 ENCOUNTER — Inpatient Hospital Stay: Payer: Medicare Other | Attending: Oncology | Admitting: Oncology

## 2014-07-22 VITALS — BP 225/66 | HR 63 | Temp 95.6°F | Ht 66.0 in | Wt 114.4 lb

## 2014-07-22 DIAGNOSIS — K5900 Constipation, unspecified: Secondary | ICD-10-CM | POA: Diagnosis not present

## 2014-07-22 DIAGNOSIS — R5383 Other fatigue: Secondary | ICD-10-CM | POA: Diagnosis not present

## 2014-07-22 DIAGNOSIS — R232 Flushing: Secondary | ICD-10-CM | POA: Insufficient documentation

## 2014-07-22 DIAGNOSIS — Z7982 Long term (current) use of aspirin: Secondary | ICD-10-CM | POA: Insufficient documentation

## 2014-07-22 DIAGNOSIS — C7A012 Malignant carcinoid tumor of the ileum: Secondary | ICD-10-CM | POA: Insufficient documentation

## 2014-07-22 DIAGNOSIS — C7A8 Other malignant neuroendocrine tumors: Secondary | ICD-10-CM

## 2014-07-22 DIAGNOSIS — K219 Gastro-esophageal reflux disease without esophagitis: Secondary | ICD-10-CM | POA: Insufficient documentation

## 2014-07-22 DIAGNOSIS — Z79899 Other long term (current) drug therapy: Secondary | ICD-10-CM | POA: Insufficient documentation

## 2014-07-22 DIAGNOSIS — F410 Panic disorder [episodic paroxysmal anxiety] without agoraphobia: Secondary | ICD-10-CM | POA: Insufficient documentation

## 2014-07-22 DIAGNOSIS — I10 Essential (primary) hypertension: Secondary | ICD-10-CM | POA: Insufficient documentation

## 2014-07-22 DIAGNOSIS — M810 Age-related osteoporosis without current pathological fracture: Secondary | ICD-10-CM | POA: Insufficient documentation

## 2014-07-22 DIAGNOSIS — R197 Diarrhea, unspecified: Secondary | ICD-10-CM | POA: Diagnosis not present

## 2014-07-22 DIAGNOSIS — D3A00 Benign carcinoid tumor of unspecified site: Secondary | ICD-10-CM

## 2014-07-22 DIAGNOSIS — C7A00 Malignant carcinoid tumor of unspecified site: Secondary | ICD-10-CM

## 2014-07-22 MED ORDER — LACTULOSE 10 GM/15ML PO SOLN: 10.0000 g | ORAL | Status: DC | PRN

## 2014-07-24 ENCOUNTER — Encounter: Payer: Self-pay | Admitting: Oncology

## 2014-07-24 ENCOUNTER — Other Ambulatory Visit: Payer: Self-pay | Admitting: Oncology

## 2014-07-24 DIAGNOSIS — C7A8 Other malignant neuroendocrine tumors: Secondary | ICD-10-CM | POA: Insufficient documentation

## 2014-07-24 NOTE — Progress Notes (Signed)
Note Type Oncology Follow Up   HPI: Referred by PCP is [place].   This 81 year old Female patient presents to the clinic for follow up  for carcinoid of the jejuno-ileal .   Subjective: Chief Complaint/Diagnosis:   1.carcinoid tumor of the jejuno-ileal status post initial diagnosis in Dece 2002  2.MIBG scan in December of 2008 revealed suspicious metastatic disease and this was started on Sandostatin LAR carefully monitored every 3 months 3.  Recently patient has moved to [facility] (assisted living facility) to be near family and under hospice care   HPI:    81 year old lady who is a previous history of stage IV carcinoid tumor diagnosis in December of 2002.  Patient was in the emergency room with a history of abdominal pain.  With gradually got worse.  Patient was constipated.  No nausea no vomiting underwent CT scan following that patient had episode of diarrhea.  CT scan revealed progressing tumor with omental metastases.  Since then patient had frequent episodes of diarrhea with hot flashes.  Appetite has been stable.  No chills.  No fever.  Review of Systems:  General: fatigue  Performance Status (ECOG): 1  HEENT: no complaints  Lungs: no complaints  Cardiac: no complaints  GI: no complaints  GU: no complaints  Musculoskeletal: no complaints  Extremities: no complaints  Endocrine: no complaints  Psych: anxiety  Pain ?: No complaints (0, none)  Review of Systems: All other systems were reviewed and found to be negative   Allergies:  Augmentin ES-600: Rash, GI Distress  Penicillin: Unknown  Significant History/PMH:   Hypertension:   Preventive Screening:  Has patient had any of the following test? Colonscopy  Mammography   Last Colonoscopy: few years   Last Mammography: 2012   Smoking History: Smoking History Never Smoked.  PFSH: Additional Past Medical and Surgical History: Osteoporosis     Diabetes mellitus type II diet-controlled..   Phlebitis in  lower extremity.    Gastroesophageal reflux and high rectal hernia   Hypertensin   Home Medications: Medication Instructions Last Modified Date/Time  lisinopril 20 mg oral tablet 1 tab(s) orally once a day 29-Jun-15 10:59  aspirin 81 mg oral tablet 1 tab(s) orally once a day 29-Jun-15 10:59  cloNIDine 0.1 mg/24 hr transdermal film, extended release 1 patch transdermal once a week on Thursday 29-Jun-15 10:59  docusate sodium sodium 100 mg oral capsule 1 cap(s) orally once a day as needed for constipation 29-Jun-15 10:59  Centrum Silver Therapeutic Multiple Vitamins with Minerals oral tablet 1 tab(s) orally once a day 29-Jun-15 10:59  Vitamin D3 2000 intl units oral capsule 1 cap(s) orally once a day (at bedtime) 29-Jun-15 10:59   Vital Signs:  :: Wt(KG): 52.1 Temp: 96.5 Pulse: 67 RR: 18  BP: 202/63   Physical Exam:  General: Patient is alert and oriented not in any acute distress.  Mental Status: alert and oriented to person, place and time  Eyes: pupils  are equal reacting to light.    No jaundice  Head, Ears, Nose,Throat: normal, no lesions or deformities  no jaundice   throat: No redness.  No mucositis.  No stomatitis  Neck, Thyroid: no thyroid tenderness, enlargement or nodule.  neck supple without massess or tenderness. no adenopathy.  Respiratory: lungs: Air entry equal on both sides   No rhonchi.  No crepitation   No  dullness on percussion    No tenderness  Cardiovascular: Heart sounds are normal  Rhythm isregular   No murmur.  No gallop.  No pericardial rub  Breast: Not examined  Gastrointestinal: Abdomen: Liver not palpable   Spleen not palpable   No tenderness   Bowel sounds are present   No ascites  Musculoskeletal: No deformity..   No evidence of fracture..   No joint swelling..   Lower extremity no edema  Skin: no rashes, ulcers, or lesions   No ecchymosis.  No petechial hemorrhage  Neurological: Higher functions have been normal limit.      Cranial nerves  are intact   Motor system no evidence of localizing weakness   Sensory system no evidence of localizing weakness   No evidence of peripheral neuropathy  Lymphatics: no cervical, axillary, or inguinal lymphadenopathy      Assessment and Plan: Stage IV carcinoid tumor.Patient presented today as an acute add-on because of previous history of abdominal pain CT scan revealed progressing tumor.  Patient had few episodes suggesting breakthrough carcinoid syndrome symptoms. I reviewed CT scan independently We are going to try to increase Sandostatin LAR dose and see whether patient continues to have symptoms.  24-hour urine for 5 days HIAA but may be difficult to collect .  Nose of Sandostatin LAR would be increased during the next due date of Sandostatin.   lab has been reviewed    L

## 2014-08-02 ENCOUNTER — Inpatient Hospital Stay: Payer: Medicare Other

## 2014-08-02 VITALS — BP 185/61 | HR 66 | Temp 97.5°F | Resp 18

## 2014-08-02 DIAGNOSIS — C7A8 Other malignant neuroendocrine tumors: Secondary | ICD-10-CM

## 2014-08-02 DIAGNOSIS — C7A00 Malignant carcinoid tumor of unspecified site: Secondary | ICD-10-CM

## 2014-08-02 DIAGNOSIS — C7A012 Malignant carcinoid tumor of the ileum: Secondary | ICD-10-CM | POA: Diagnosis not present

## 2014-08-02 MED ORDER — OCTREOTIDE ACETATE 30 MG IM KIT
30.0000 mg | PACK | Freq: Once | INTRAMUSCULAR | Status: AC
  Administered 2014-08-02: 30 mg via INTRAMUSCULAR

## 2014-08-02 MED ORDER — OCTREOTIDE ACETATE 30 MG IM KIT
PACK | INTRAMUSCULAR | Status: AC
  Filled 2014-08-02: qty 1

## 2014-08-09 ENCOUNTER — Telehealth: Payer: Self-pay | Admitting: *Deleted

## 2014-08-09 MED ORDER — LEVOFLOXACIN 500 MG PO TABS: 500.0000 mg | ORAL_TABLET | Freq: Every day | ORAL | Status: DC

## 2014-08-09 NOTE — Telephone Encounter (Signed)
Levaquin 500 mg daily times 7 days Linna Hoff informed of rx sent into pharmacy

## 2014-08-09 NOTE — Telephone Encounter (Signed)
Has redness and swelling at Sandostatin injection site

## 2014-08-18 ENCOUNTER — Telehealth: Payer: Self-pay | Admitting: *Deleted

## 2014-08-18 NOTE — Telephone Encounter (Signed)
States patient does not use [facility] and confused as to why they were getting rx from there. I deleted [facility] form the list of pharmacies

## 2014-08-30 ENCOUNTER — Ambulatory Visit: Payer: Medicare Other

## 2014-09-06 ENCOUNTER — Ambulatory Visit: Payer: Medicare Other

## 2014-09-06 ENCOUNTER — Other Ambulatory Visit: Payer: Medicare Other

## 2014-09-06 ENCOUNTER — Ambulatory Visit: Payer: Medicare Other | Admitting: Oncology

## 2014-09-14 ENCOUNTER — Ambulatory Visit: Payer: Medicare Other

## 2014-09-14 ENCOUNTER — Inpatient Hospital Stay: Payer: Medicare Other | Attending: Oncology

## 2014-09-14 ENCOUNTER — Inpatient Hospital Stay (HOSPITAL_BASED_OUTPATIENT_CLINIC_OR_DEPARTMENT_OTHER): Payer: Medicare Other | Admitting: Oncology

## 2014-09-14 ENCOUNTER — Inpatient Hospital Stay: Payer: Medicare Other

## 2014-09-14 VITALS — BP 208/43 | HR 57 | Temp 96.8°F | Wt 111.3 lb

## 2014-09-14 DIAGNOSIS — E785 Hyperlipidemia, unspecified: Secondary | ICD-10-CM | POA: Insufficient documentation

## 2014-09-14 DIAGNOSIS — C7A011 Malignant carcinoid tumor of the jejunum: Secondary | ICD-10-CM | POA: Diagnosis not present

## 2014-09-14 DIAGNOSIS — E119 Type 2 diabetes mellitus without complications: Secondary | ICD-10-CM

## 2014-09-14 DIAGNOSIS — Z79899 Other long term (current) drug therapy: Secondary | ICD-10-CM | POA: Insufficient documentation

## 2014-09-14 DIAGNOSIS — D509 Iron deficiency anemia, unspecified: Secondary | ICD-10-CM

## 2014-09-14 DIAGNOSIS — C7A00 Malignant carcinoid tumor of unspecified site: Secondary | ICD-10-CM

## 2014-09-14 DIAGNOSIS — C779 Secondary and unspecified malignant neoplasm of lymph node, unspecified: Secondary | ICD-10-CM | POA: Diagnosis not present

## 2014-09-14 DIAGNOSIS — Z8673 Personal history of transient ischemic attack (TIA), and cerebral infarction without residual deficits: Secondary | ICD-10-CM | POA: Insufficient documentation

## 2014-09-14 DIAGNOSIS — Z7982 Long term (current) use of aspirin: Secondary | ICD-10-CM | POA: Diagnosis not present

## 2014-09-14 DIAGNOSIS — R197 Diarrhea, unspecified: Secondary | ICD-10-CM | POA: Insufficient documentation

## 2014-09-14 DIAGNOSIS — I10 Essential (primary) hypertension: Secondary | ICD-10-CM

## 2014-09-14 DIAGNOSIS — F329 Major depressive disorder, single episode, unspecified: Secondary | ICD-10-CM | POA: Diagnosis not present

## 2014-09-14 DIAGNOSIS — D3A00 Benign carcinoid tumor of unspecified site: Secondary | ICD-10-CM

## 2014-09-14 DIAGNOSIS — C7A8 Other malignant neuroendocrine tumors: Secondary | ICD-10-CM

## 2014-09-14 LAB — COMPREHENSIVE METABOLIC PANEL
ALK PHOS: 89 U/L (ref 38–126)
ALT: 49 U/L (ref 14–54)
AST: 43 U/L — AB (ref 15–41)
Albumin: 3.1 g/dL — ABNORMAL LOW (ref 3.5–5.0)
Anion gap: 3 — ABNORMAL LOW (ref 5–15)
BUN: 11 mg/dL (ref 6–20)
CHLORIDE: 100 mmol/L — AB (ref 101–111)
CO2: 30 mmol/L (ref 22–32)
Calcium: 8.1 mg/dL — ABNORMAL LOW (ref 8.9–10.3)
Creatinine, Ser: 0.59 mg/dL (ref 0.44–1.00)
GFR calc Af Amer: >60 mL/min (ref >60)
GFR calc non Af Amer: >60 mL/min (ref >60)
Glucose, Bld: 278 mg/dL — ABNORMAL HIGH (ref 65–99)
POTASSIUM: 3.5 mmol/L (ref 3.5–5.1)
Sodium: 133 mmol/L — ABNORMAL LOW (ref 135–145)
Total Bilirubin: 0.5 mg/dL (ref 0.3–1.2)
Total Protein: 7 g/dL (ref 6.5–8.1)

## 2014-09-14 LAB — CBC WITH DIFFERENTIAL/PLATELET
BASOS ABS: 0.1 10*3/uL (ref 0–0.1)
BASOS PCT: 1 %
EOS ABS: 0.1 10*3/uL (ref 0–0.7)
Eosinophils Relative: 2 %
HCT: 34 % — ABNORMAL LOW (ref 35.0–47.0)
Hemoglobin: 10.8 g/dL — ABNORMAL LOW (ref 12.0–16.0)
Lymphocytes Relative: 20 %
Lymphs Abs: 1.3 10*3/uL (ref 1.0–3.6)
MCH: 26.5 pg (ref 26.0–34.0)
MCHC: 31.6 g/dL — AB (ref 32.0–36.0)
MCV: 83.8 fL (ref 80.0–100.0)
MONO ABS: 0.5 10*3/uL (ref 0.2–0.9)
Monocytes Relative: 8 %
Neutro Abs: 4.7 10*3/uL (ref 1.4–6.5)
Neutrophils Relative %: 69 %
PLATELETS: 221 10*3/uL (ref 150–440)
RBC: 4.06 MIL/uL (ref 3.80–5.20)
RDW: 16.5 % — AB (ref 11.5–14.5)
WBC: 6.6 10*3/uL (ref 3.6–11.0)

## 2014-09-14 MED ORDER — OCTREOTIDE ACETATE 30 MG IM KIT
30.0000 mg | PACK | Freq: Once | INTRAMUSCULAR | Status: AC
  Administered 2014-09-14: 30 mg via INTRAMUSCULAR

## 2014-09-14 MED ORDER — TRIAMCINOLONE ACETONIDE 0.5 % EX OINT: 1.0000 "application " | TOPICAL_OINTMENT | Freq: Every day | CUTANEOUS | Status: DC

## 2014-09-14 NOTE — Progress Notes (Signed)
Patient does have living will. Never smoked. 

## 2014-09-15 ENCOUNTER — Telehealth: Payer: Self-pay

## 2014-09-15 MED ORDER — LISINOPRIL 20 MG PO TABS: 20.0000 mg | ORAL_TABLET | Freq: Every day | ORAL | Status: DC

## 2014-09-15 MED ORDER — CLONIDINE HCL 0.2 MG/24HR TD PTWK: 0.2000 mg | MEDICATED_PATCH | TRANSDERMAL | Status: DC

## 2014-09-15 MED ORDER — AMLODIPINE BESYLATE 2.5 MG PO TABS: 2.5000 mg | ORAL_TABLET | Freq: Every day | ORAL | Status: DC

## 2014-09-15 NOTE — Telephone Encounter (Signed)
Let him know to just continue the current meds for now Let me know if her BP runs up higher at home

## 2014-09-15 NOTE — Telephone Encounter (Signed)
Spoke with patient's son and advised results.  

## 2014-09-15 NOTE — Telephone Encounter (Signed)
Sophia Hill pts son (DPR signed) request lisinopril,amlodipine and clonidine to [facility]. Sophia Hill no longer wants to use [facility] and Linna Hoff will notify [facility] to d/c rxs. Advised Sophia Hill refills done to [facility] per protocol.Sophia Hill also wanted Dr Silvio Pate to know that pt's BP has been elevated when taken the last 6 weeks. BP usually averages 160-175/50-60 but at Dr Metro Kung office has been running 208-215/55. Linna Hoff wants to know if continue same meds or does something need to be increased. Pt has been taking lisinopril, amlodipine and clonidine patch as instructed. Pt has no complaints; no h/a dizziness,CP or SOB. Sophia Hill request cb.

## 2014-09-15 NOTE — Telephone Encounter (Signed)
Duplicate-opened in error. 

## 2014-09-17 ENCOUNTER — Encounter: Payer: Self-pay | Admitting: Oncology

## 2014-09-17 NOTE — Progress Notes (Signed)
[place] @ [facility] Telephone:([phone]  Fax:(336) [place]: [birth year]  MR#: [id number]  XTK#:[id number]  Patient Care Team: Venia Carbon, MD as PCP - General (Pediatrics)  CHIEF COMPLAINT:  Chief Complaint  Patient presents with  . Follow-up    Oncology History   1. 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 MIBG scan in December of 2008 revealed suspicious metastatic disease and this was started on Sandostatin LAR carefully monitored every 6 months 3.  Patient relocated to [place], [place] in an assisted living facility. Was previously under home hospice care, hospice has now discharged her from care.      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  Patient is on Sandostatin LAR Patient continues to have intermittent diarrhea. No abdominal pain no nausea no vomiting no significant weight loss Recently had noticed high blood pressure patient is on multiple high blood pressure medication being managed by primary care physician REVIEW OF SYSTEMS:   Gen. status: Patient lives in assisted living facility no chills or fever GI: No nausea no vomiting no significant weight loss patient has intermittent diarrhea GU: No dysuria hematuria Musculoskeletal system joint pain Skin: No rash Neurological system: No tingling numbness Lungs: No cough. Cardiac: No chest pain.   particularly systolic blood pressure is slightly high As per HPI. Otherwise, a complete review of systems is negatve.  PAST MEDICAL HISTORY: Past Medical History  Diagnosis Date  . Hyperlipidemia   . Hypertension   . Carcinoid tumor 2005    [facility]  . Depression   . Late effects of CVA (cerebrovascular accident)   .  Osteoarthritis, multiple sites   . Anemia     iron deficiency in the past  . Type II or unspecified type diabetes mellitus without mention of complication, not stated as uncontrolled     PAST SURGICAL HISTORY: Past Surgical History  Procedure Laterality Date  . Tonsillectomy    . Colon resection  2005    for carcinoid  . Hemorrhoid surgery      FAMILY HISTORY Family History  Problem Relation Age of Onset  . Stroke Mother   . Hypertension Mother   . Cancer Father   . Heart disease Father   . Diabetes Brother     ADVANCED DIRECTIVES:  No flowsheet data found.  HEALTH MAINTENANCE: History  Substance Use Topics  . Smoking status: Never Smoker   . Smokeless tobacco: Never Used  . Alcohol Use: No      Allergies  Allergen Reactions  . Penicillin G     Other reaction(s): Unknown  . Augmentin [Amoxicillin-Pot Clavulanate] Rash    Current Outpatient Prescriptions  Medication Sig Dispense Refill  . acetaminophen (TYLENOL) 650 MG CR tablet Take 650 mg by mouth 3 (three) times daily as needed.    Marland Kitchen aspirin 81 MG tablet Take 81 mg by mouth daily.    . Cholecalciferol (VITAMIN D) 2000 UNITS CAPS Take by mouth daily.    Marland Kitchen lactulose (CHRONULAC) 10 GM/15ML solution Take 15 mLs (10 g total) by mouth every 4 (four) hours as needed for mild constipation (until has bowel movement). 240 mL 0  . Multiple Vitamins-Minerals (CENTRUM SILVER ADULT 50+) TABS Take by mouth daily.    Marland Kitchen  amLODipine (NORVASC) 2.5 MG tablet Take 1 tablet (2.5 mg total) by mouth daily. 90 tablet 1  . cloNIDine (CATAPRES - DOSED IN MG/24 HR) 0.2 mg/24hr patch Place 1 patch (0.2 mg total) onto the skin once a week. 12 patch 1  . levofloxacin (LEVAQUIN) 500 MG tablet Take 1 tablet (500 mg total) by mouth daily. (Patient not taking: Reported on 09/14/2014) 7 tablet 0  . lisinopril (PRINIVIL,ZESTRIL) 20 MG tablet Take 1 tablet (20 mg total) by mouth daily. 90 tablet 1  . octreotide (SANDOSTATIN LAR DEPOT) 10 MG  injection Inject 10 mg into the muscle every 28 (twenty-eight) days. Not sure of dose    . triamcinolone ointment (KENALOG) 0.5 % Apply 1 application topically daily. 30 g 0   No current facility-administered medications for this visit.    OBJECTIVE:  Filed Vitals:   09/14/14 0943  BP: 208/43  Pulse: 57  Temp: 96.8 F (36 C)     Body mass index is 17.98 kg/(m^2).    ECOG FS:1 - Symptomatic but completely ambulatory  PHYSICAL EXAM: Gen. status: Patient is alert oriented not any acute distress Lymphatic system: Supraclavicular, cervical, axillary, inguinal lymph nodes are not palpable Cardiac exam revealed the PMI to be normally situated and sized. The rhythm was regular and no extrasystoles were noted during several minutes of auscultation. The first and second heart sounds were normal and physiologic splitting of the second heart sound was noted. There were no murmurs, rubs, clicks, or gallops. Examination of the chest was unremarkable. There were no bony deformities, no asymmetry, and no other abnormalities. Abdomen: Soft no palpable masses no ascites slight tenderness in lower quadrant bowel sounds are present Examination of the skin revealed no evidence of significant rashes, suspicious appearing nevi or other concerning lesions. Neurologically, the patient was awake, alert, and oriented to person, place and time. There were no obvious focal neurologic abnormalities.   LAB RESULTS:  Appointment on 09/14/2014  Component Date Value Ref Range Status  . WBC 09/14/2014 6.6  3.6 - 11.0 K/uL Final  . RBC 09/14/2014 4.06  3.80 - 5.20 MIL/uL Final  . Hemoglobin 09/14/2014 10.8* 12.0 - 16.0 g/dL Final  . HCT 09/14/2014 34.0* 35.0 - 47.0 % Final  . MCV 09/14/2014 83.8  80.0 - 100.0 fL Final  . MCH 09/14/2014 26.5  26.0 - 34.0 pg Final  . MCHC 09/14/2014 31.6* 32.0 - 36.0 g/dL Final  . RDW 09/14/2014 16.5* 11.5 - 14.5 % Final  . Platelets 09/14/2014 221  150 - 440 K/uL Final  .  Neutrophils Relative % 09/14/2014 69   Final  . Neutro Abs 09/14/2014 4.7  1.4 - 6.5 K/uL Final  . Lymphocytes Relative 09/14/2014 20   Final  . Lymphs Abs 09/14/2014 1.3  1.0 - 3.6 K/uL Final  . Monocytes Relative 09/14/2014 8   Final  . Monocytes Absolute 09/14/2014 0.5  0.2 - 0.9 K/uL Final  . Eosinophils Relative 09/14/2014 2   Final  . Eosinophils Absolute 09/14/2014 0.1  0 - 0.7 K/uL Final  . Basophils Relative 09/14/2014 1   Final  . Basophils Absolute 09/14/2014 0.1  0 - 0.1 K/uL Final  . Sodium 09/14/2014 133* 135 - 145 mmol/L Final  . Potassium 09/14/2014 3.5  3.5 - 5.1 mmol/L Final  . Chloride 09/14/2014 100* 101 - 111 mmol/L Final  . CO2 09/14/2014 30  22 - 32 mmol/L Final  . Glucose, Bld 09/14/2014 278* 65 - 99 mg/dL Final  . BUN 09/14/2014  11  6 - 20 mg/dL Final  . Creatinine, Ser 09/14/2014 0.59  0.44 - 1.00 mg/dL Final  . Calcium 09/14/2014 8.1* 8.9 - 10.3 mg/dL Final  . Total Protein 09/14/2014 7.0  6.5 - 8.1 g/dL Final  . Albumin 09/14/2014 3.1* 3.5 - 5.0 g/dL Final  . AST 09/14/2014 43* 15 - 41 U/L Final  . ALT 09/14/2014 49  14 - 54 U/L Final  . Alkaline Phosphatase 09/14/2014 89  38 - 126 U/L Final  . Total Bilirubin 09/14/2014 0.5  0.3 - 1.2 mg/dL Final  . GFR calc non Af Amer 09/14/2014 >60  >60 mL/min Final  . GFR calc Af Amer 09/14/2014 >60  >60 mL/min Final   Comment: (NOTE) The eGFR has been calculated using the CKD EPI equation. This calculation has not been validated in all clinical situations. eGFR's persistently <60 mL/min signify possible Chronic Kidney Disease.   . 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 She also understands that She can call clinic at any time with any questions, concerns, or complaints.    Neuroendocrine carcinoma of colon   Staging form: Merkel Cell Carcinoma, AJCC 7th Edition     Clinical: Stage IV (TX, NX, M1b) - Signed by Forest Gleason, MD on 09/17/2014   Forest Gleason, MD   09/17/2014 5:51 PM

## 2014-10-06 ENCOUNTER — Other Ambulatory Visit: Payer: Self-pay

## 2014-10-06 DIAGNOSIS — C7A8 Other malignant neuroendocrine tumors: Secondary | ICD-10-CM

## 2014-10-07 ENCOUNTER — Ambulatory Visit: Payer: Medicare Other | Admitting: Oncology

## 2014-10-07 ENCOUNTER — Other Ambulatory Visit: Payer: Medicare Other

## 2014-10-07 ENCOUNTER — Ambulatory Visit: Payer: Medicare Other

## 2014-10-09 LAB — 5 HIAA, QUANTITATIVE, URINE, 24 HOUR
5-HIAA, Ur: 20.2 mg/L
5-HIAA,Quant.,24 Hr Urine: 30.3 mg/24 hr — ABNORMAL HIGH (ref 0.0–14.9)
TOTAL VOLUME: 1500

## 2014-10-12 ENCOUNTER — Inpatient Hospital Stay: Payer: Medicare Other | Attending: Oncology

## 2014-10-12 DIAGNOSIS — C189 Malignant neoplasm of colon, unspecified: Secondary | ICD-10-CM | POA: Insufficient documentation

## 2014-10-12 DIAGNOSIS — Z79899 Other long term (current) drug therapy: Secondary | ICD-10-CM | POA: Diagnosis not present

## 2014-10-12 DIAGNOSIS — C7A00 Malignant carcinoid tumor of unspecified site: Secondary | ICD-10-CM

## 2014-10-12 DIAGNOSIS — C7A8 Other malignant neuroendocrine tumors: Secondary | ICD-10-CM

## 2014-10-12 MED ORDER — OCTREOTIDE ACETATE 30 MG IM KIT
30.0000 mg | PACK | Freq: Once | INTRAMUSCULAR | Status: AC
  Administered 2014-10-12: 30 mg via INTRAMUSCULAR

## 2014-11-08 ENCOUNTER — Telehealth: Payer: Self-pay | Admitting: *Deleted

## 2014-11-08 DIAGNOSIS — C7A8 Other malignant neuroendocrine tumors: Secondary | ICD-10-CM

## 2014-11-08 MED ORDER — SOLUBLE FIBER/PROBIOTICS PO CHEW: 1.0000 | CHEWABLE_TABLET | Freq: Three times a day (TID) | ORAL | Status: DC

## 2014-11-08 NOTE — Telephone Encounter (Signed)
Pt's son would like to know if can add probiotics to pt's daily medications. Informed Dan that adding probiotics should be okay.

## 2014-11-09 ENCOUNTER — Other Ambulatory Visit: Payer: Self-pay | Admitting: Family Medicine

## 2014-11-09 ENCOUNTER — Inpatient Hospital Stay: Payer: Medicare Other | Attending: Oncology

## 2014-11-09 VITALS — BP 194/48 | HR 64 | Temp 96.0°F | Resp 20

## 2014-11-09 DIAGNOSIS — E340 Carcinoid syndrome: Secondary | ICD-10-CM | POA: Insufficient documentation

## 2014-11-09 DIAGNOSIS — C7A8 Other malignant neuroendocrine tumors: Secondary | ICD-10-CM

## 2014-11-09 DIAGNOSIS — Z79899 Other long term (current) drug therapy: Secondary | ICD-10-CM | POA: Diagnosis not present

## 2014-11-09 DIAGNOSIS — C7A00 Malignant carcinoid tumor of unspecified site: Secondary | ICD-10-CM

## 2014-11-09 DIAGNOSIS — C7A012 Malignant carcinoid tumor of the ileum: Secondary | ICD-10-CM | POA: Insufficient documentation

## 2014-11-09 MED ORDER — OCTREOTIDE ACETATE 30 MG IM KIT
30.0000 mg | PACK | Freq: Once | INTRAMUSCULAR | Status: AC
  Administered 2014-11-09: 30 mg via INTRAMUSCULAR
  Filled 2014-11-09: qty 1

## 2014-11-10 ENCOUNTER — Telehealth: Payer: Self-pay | Admitting: *Deleted

## 2014-11-10 ENCOUNTER — Other Ambulatory Visit: Payer: Self-pay | Admitting: Family Medicine

## 2014-11-10 DIAGNOSIS — C7A8 Other malignant neuroendocrine tumors: Secondary | ICD-10-CM

## 2014-11-10 NOTE — Telephone Encounter (Signed)
I thought that was what was going on I don't think we need to make any medication changes but it would be good if Sophia Hill (DIL who was nurse) could check her BP sometime when she is calm and make sure it isn't always that high

## 2014-11-10 NOTE — Telephone Encounter (Signed)
Spoke with son about the BP readings that we got from Dr. Oliva Bustard 194/48, per son she gets very nervous when she goes to any dr and mostly after the injections with Dr. Oliva Bustard her BP is elevated. Per son she has good days and bad days, they have discussed with Dr. Oliva Bustard may considering pt again for hospice care. Son states if Dr. Silvio Pate is concerned about the low number on pt's BP then they will wait. Son states pt has had 4 "blowouts" after the cancer center visits.  Please advise

## 2014-11-10 NOTE — Telephone Encounter (Signed)
Spoke with patient's son and advised results. He will let his wife check pt's BP and call if anything is abnormal.

## 2014-11-12 NOTE — Telephone Encounter (Signed)
.  left message to have patient's daughter in-law return my call.  

## 2014-11-12 NOTE — Telephone Encounter (Signed)
Daughter in law [name] returned call today, spoke to [place] and stated that Dr. Oliva Bustard will refer pt to Hospice.  Best number to call is [phone]

## 2014-11-12 NOTE — Telephone Encounter (Signed)
Sophia Hill (daughter in law) wanted to know if Dr Sophia Hill will order hospice.  She stated Sophia Hill has declined this past month.  She weighs 111.  She still has bowel movements (blow outs). They spoke to dr Oliva Bustard  office about getting hospice and have not heard anything back  Sophia Hill is having a good day today.  This is the first this week.  She has been weak.  She is getting more confused daily  Please call Sophia Hill  [phone]

## 2014-11-13 NOTE — Telephone Encounter (Signed)
Okay Make sure she is set to come in within the next few weeks so we can review things

## 2014-11-15 ENCOUNTER — Telehealth: Payer: Self-pay | Admitting: *Deleted

## 2014-11-15 NOTE — Telephone Encounter (Signed)
.  left message to have patient return my call.  

## 2014-11-15 NOTE — Telephone Encounter (Signed)
Per Darcus Pester with hospice. Unable to admit patient to hospice per hospice medical director Dr. Nyra Capes due to oncology tx. Pt is currently being tx with sandostatin for neuroendocrine tumor. Son will be calling cancer center to arrange for a family conference/consultation with Dr. Oliva Bustard to discuss the rationale of stopping or continuing tx.

## 2014-11-16 ENCOUNTER — Telehealth: Payer: Self-pay | Admitting: *Deleted

## 2014-11-16 DIAGNOSIS — C7A8 Other malignant neuroendocrine tumors: Secondary | ICD-10-CM

## 2014-11-16 MED ORDER — DIPHENOXYLATE-ATROPINE 2.5-0.025 MG PO TABS: 1.0000 | ORAL_TABLET | ORAL | Status: DC | PRN

## 2014-11-16 NOTE — Telephone Encounter (Signed)
Still having horrible diarrhea and Imodium is not working, needs something stronger, Has had 4 blow outs today

## 2014-11-16 NOTE — Telephone Encounter (Signed)
Asking about referral to hospice. Informed by Angie Fava that they cannot open her to services due to her Sandostatin injections

## 2014-11-16 NOTE — Telephone Encounter (Signed)
Pt is having uncontrollable diarrhea with weakness and weight loss. Imodium is not effective. Family requests hospice referral but would like to discuss options with MD at appt this week.

## 2014-11-16 NOTE — Telephone Encounter (Signed)
Informed Sophia Hill that Lomotil was called in

## 2014-11-18 ENCOUNTER — Inpatient Hospital Stay: Payer: Medicare Other | Attending: Oncology | Admitting: *Deleted

## 2014-11-18 ENCOUNTER — Encounter: Payer: Self-pay | Admitting: Oncology

## 2014-11-18 ENCOUNTER — Inpatient Hospital Stay (HOSPITAL_BASED_OUTPATIENT_CLINIC_OR_DEPARTMENT_OTHER): Payer: Medicare Other | Admitting: Oncology

## 2014-11-18 ENCOUNTER — Inpatient Hospital Stay: Payer: Medicare Other

## 2014-11-18 VITALS — BP 220/64 | HR 68 | Temp 97.0°F | Wt 110.0 lb

## 2014-11-18 DIAGNOSIS — I10 Essential (primary) hypertension: Secondary | ICD-10-CM | POA: Diagnosis not present

## 2014-11-18 DIAGNOSIS — R197 Diarrhea, unspecified: Secondary | ICD-10-CM | POA: Diagnosis not present

## 2014-11-18 DIAGNOSIS — C779 Secondary and unspecified malignant neoplasm of lymph node, unspecified: Secondary | ICD-10-CM | POA: Diagnosis not present

## 2014-11-18 DIAGNOSIS — R5383 Other fatigue: Secondary | ICD-10-CM | POA: Insufficient documentation

## 2014-11-18 DIAGNOSIS — E785 Hyperlipidemia, unspecified: Secondary | ICD-10-CM

## 2014-11-18 DIAGNOSIS — C7A011 Malignant carcinoid tumor of the jejunum: Secondary | ICD-10-CM | POA: Diagnosis not present

## 2014-11-18 DIAGNOSIS — C7A8 Other malignant neuroendocrine tumors: Secondary | ICD-10-CM

## 2014-11-18 DIAGNOSIS — R531 Weakness: Secondary | ICD-10-CM | POA: Insufficient documentation

## 2014-11-18 DIAGNOSIS — M1990 Unspecified osteoarthritis, unspecified site: Secondary | ICD-10-CM

## 2014-11-18 DIAGNOSIS — Z79899 Other long term (current) drug therapy: Secondary | ICD-10-CM

## 2014-11-18 DIAGNOSIS — E119 Type 2 diabetes mellitus without complications: Secondary | ICD-10-CM | POA: Diagnosis not present

## 2014-11-18 DIAGNOSIS — Z8673 Personal history of transient ischemic attack (TIA), and cerebral infarction without residual deficits: Secondary | ICD-10-CM

## 2014-11-18 DIAGNOSIS — Z7982 Long term (current) use of aspirin: Secondary | ICD-10-CM | POA: Diagnosis not present

## 2014-11-18 LAB — COMPREHENSIVE METABOLIC PANEL
ALT: 82 U/L — ABNORMAL HIGH (ref 14–54)
ANION GAP: 3 — AB (ref 5–15)
AST: 38 U/L (ref 15–41)
Albumin: 3.1 g/dL — ABNORMAL LOW (ref 3.5–5.0)
Alkaline Phosphatase: 372 U/L — ABNORMAL HIGH (ref 38–126)
BUN: 14 mg/dL (ref 6–20)
CO2: 29 mmol/L (ref 22–32)
Calcium: 8.1 mg/dL — ABNORMAL LOW (ref 8.9–10.3)
Chloride: 102 mmol/L (ref 101–111)
Creatinine, Ser: 0.71 mg/dL (ref 0.44–1.00)
Glucose, Bld: 273 mg/dL — ABNORMAL HIGH (ref 65–99)
Potassium: 3.6 mmol/L (ref 3.5–5.1)
SODIUM: 134 mmol/L — AB (ref 135–145)
TOTAL PROTEIN: 7.4 g/dL (ref 6.5–8.1)
Total Bilirubin: 0.8 mg/dL (ref 0.3–1.2)

## 2014-11-18 LAB — CBC WITH DIFFERENTIAL/PLATELET
Basophils Absolute: 0.1 10*3/uL (ref 0–0.1)
Basophils Relative: 2 %
EOS ABS: 0.1 10*3/uL (ref 0–0.7)
Eosinophils Relative: 1 %
HCT: 34.3 % — ABNORMAL LOW (ref 35.0–47.0)
HEMOGLOBIN: 11.2 g/dL — AB (ref 12.0–16.0)
LYMPHS ABS: 1.5 10*3/uL (ref 1.0–3.6)
Lymphocytes Relative: 25 %
MCH: 27 pg (ref 26.0–34.0)
MCHC: 32.6 g/dL (ref 32.0–36.0)
MCV: 82.9 fL (ref 80.0–100.0)
MONOS PCT: 6 %
Monocytes Absolute: 0.4 10*3/uL (ref 0.2–0.9)
NEUTROS PCT: 66 %
Neutro Abs: 4 10*3/uL (ref 1.4–6.5)
Platelets: 278 10*3/uL (ref 150–440)
RBC: 4.14 MIL/uL (ref 3.80–5.20)
RDW: 16.2 % — ABNORMAL HIGH (ref 11.5–14.5)
WBC: 6.1 10*3/uL (ref 3.6–11.0)

## 2014-11-18 LAB — MAGNESIUM: MAGNESIUM: 1.8 mg/dL (ref 1.7–2.4)

## 2014-11-18 MED ORDER — DIPHENOXYLATE-ATROPINE 2.5-0.025 MG PO TABS: 1.0000 | ORAL_TABLET | ORAL | Status: DC | PRN

## 2014-11-18 NOTE — Progress Notes (Signed)
Patient does have living will.  Never smoked.  Patient started taking probiotics and has had onset of severe diarrhea since Tuesday.

## 2014-11-22 ENCOUNTER — Encounter: Payer: Self-pay | Admitting: Oncology

## 2014-11-22 NOTE — Progress Notes (Signed)
[place] @ [facility] Telephone:(336) [phone]  Fax:(336) [phone]     Sophia Hill OB: November 05, 1921  MR#: [id number]  YDX#:[id number]  Patient Care Team: Venia Carbon, MD as PCP - General (Pediatrics)  CHIEF COMPLAINT:  Chief Complaint  Patient presents with  . Follow-up   Oncology History   1. 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 MIBG scan in December of 2008 revealed suspicious metastatic disease and this was started on Sandostatin LAR carefully monitored every 6 months 3.  Patient relocated to [place], [place] in an assisted living facility. Was previously under home hospice care, hospice has now discharged her from care.       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  Patient is on Sandostatin LAR Patient continues to have intermittent diarrhea. No abdominal pain no nausea no vomiting no significant weight loss Recently had noticed high blood pressure patient is on multiple high blood pressure medication being managed by primary care physician.  November 18, 2014 We have received number of phone call that patient had multiple diarrheal stool in the last few days.  Patient describes 3-4 times explosive diarrhea. No abdominal pain.  No chills no fever no rectal bleeding.  Diarrhea appears to be watery Family is quite concerned that Sandostatin LAR is not working they are considering stopping it and getting a hospice help. Patient is still ambulatory  REVIEW OF SYSTEMS:    general status: Patient is feeling weak and tired.  No change in a performance status.  No chills.  No fever. HEENT:   No evidence of stomatitis Lungs: No cough or shortness of breath Cardiac: No chest pain or paroxysmal nocturnal dyspnea GI: As per history of present  illness Skin: No rash Lower extremity no swelling Neurological system: No tingling.  No numbness.  No other focal signs Musculoskeletal system no bony pains .  Family is quite concerned with episodes of diarrhea   particularly systolic blood pressure is slightly high As per HPI. Otherwise, a complete review of systems is negatve.  PAST MEDICAL HISTORY: Past Medical History  Diagnosis Date  . Hyperlipidemia   . Hypertension   . Carcinoid tumor 2005    [facility]  . Depression   . Late effects of CVA (cerebrovascular accident)   . Osteoarthritis, multiple sites   . Anemia     iron deficiency in the past  . Type II or unspecified type diabetes mellitus without mention of complication, not stated as uncontrolled     PAST SURGICAL HISTORY: Past Surgical History  Procedure Laterality Date  . Tonsillectomy    . Colon resection  2005    for carcinoid  . Hemorrhoid surgery      FAMILY HISTORY Family History  Problem Relation Age of Onset  . Stroke Mother   . Hypertension Mother   . Cancer Father   . Heart disease Father   . Diabetes Brother     ADVANCED DIRECTIVES:  Patient does have advance healthcare directive, Patient   does not desire to make any changes HEALTH MAINTENANCE: Social History  Substance Use Topics  . Smoking status: Never Smoker   . Smokeless tobacco: Never Used  . Alcohol Use: No      Allergies  Allergen Reactions  . Penicillin G  Other reaction(s): Unknown  . Augmentin [Amoxicillin-Pot Clavulanate] Rash    Current Outpatient Prescriptions  Medication Sig Dispense Refill  . amLODipine (NORVASC) 2.5 MG tablet Take 1 tablet (2.5 mg total) by mouth daily. 90 tablet 1  . aspirin 81 MG tablet Take 81 mg by mouth daily.    . Cholecalciferol (VITAMIN D) 2000 UNITS CAPS Take by mouth daily.    . cloNIDine (CATAPRES - DOSED IN MG/24 HR) 0.2 mg/24hr patch Place 1 patch (0.2 mg total) onto the skin once a week. 12  patch 1  . diphenoxylate-atropine (LOMOTIL) 2.5-0.025 MG per tablet Take 1 tablet by mouth every 4 (four) hours as needed for diarrhea or loose stools. 60 tablet 0  . lisinopril (PRINIVIL,ZESTRIL) 20 MG tablet Take 1 tablet (20 mg total) by mouth daily. 90 tablet 1  . Multiple Vitamins-Minerals (CENTRUM SILVER ADULT 50+) TABS Take by mouth daily.    Marland Kitchen octreotide (SANDOSTATIN LAR DEPOT) 10 MG injection Inject 10 mg into the muscle every 28 (twenty-eight) days. Not sure of dose    . Probiotic Product (SOLUBLE FIBER/PROBIOTICS) CHEW Chew 1 tablet by mouth 3 (three) times daily.    Marland Kitchen acetaminophen (TYLENOL) 650 MG CR tablet Take 650 mg by mouth 3 (three) times daily as needed.    . lactulose (CHRONULAC) 10 GM/15ML solution Take 15 mLs (10 g total) by mouth every 4 (four) hours as needed for mild constipation (until has bowel movement). (Patient not taking: Reported on 11/18/2014) 240 mL 0  . triamcinolone ointment (KENALOG) 0.5 % Apply 1 application topically daily. (Patient not taking: Reported on 11/18/2014) 30 g 0   No current facility-administered medications for this visit.    OBJECTIVE:  Filed Vitals:   11/18/14 1359  BP: 220/64  Pulse: 68  Temp: 97 F (36.1 C)     Body mass index is 17.76 kg/(m^2).    ECOG FS:1 - Symptomatic but completely ambulatory  PHYSICAL EXAM: Gen. status: Patient is alert oriented not any acute distress Lymphatic system: Supraclavicular, cervical, axillary, inguinal lymph nodes are not palpable Cardiac exam revealed the PMI to be normally situated and sized. The rhythm was regular and no extrasystoles were noted during several minutes of auscultation. The first and second heart sounds were normal and physiologic splitting of the second heart sound was noted. There were no murmurs, rubs, clicks, or gallops. Examination of the chest was unremarkable. There were no bony deformities, no asymmetry, and no other abnormalities. Abdomen: Soft no palpable masses no ascites  slight tenderness in lower quadrant bowel sounds are present Examination of the skin revealed no evidence of significant rashes, suspicious appearing nevi or other concerning lesions. Neurologically, the patient was awake, alert, and oriented to person, place and time. There were no obvious focal neurologic abnormalities.   LAB RESULTS:  Clinical Support on 11/18/2014  Component Date Value Ref Range Status  . WBC 11/18/2014 6.1  3.6 - 11.0 K/uL Final  . RBC 11/18/2014 4.14  3.80 - 5.20 MIL/uL Final  . Hemoglobin 11/18/2014 11.2* 12.0 - 16.0 g/dL Final  . HCT 11/18/2014 34.3* 35.0 - 47.0 % Final  . MCV 11/18/2014 82.9  80.0 - 100.0 fL Final  . MCH 11/18/2014 27.0  26.0 - 34.0 pg Final  . MCHC 11/18/2014 32.6  32.0 - 36.0 g/dL Final  . RDW 11/18/2014 16.2* 11.5 - 14.5 % Final  . Platelets 11/18/2014 278  150 - 440 K/uL Final  . Neutrophils Relative % 11/18/2014 66   Final  .  Neutro Abs 11/18/2014 4.0  1.4 - 6.5 K/uL Final  . Lymphocytes Relative 11/18/2014 25   Final  . Lymphs Abs 11/18/2014 1.5  1.0 - 3.6 K/uL Final  . Monocytes Relative 11/18/2014 6   Final  . Monocytes Absolute 11/18/2014 0.4  0.2 - 0.9 K/uL Final  . Eosinophils Relative 11/18/2014 1   Final  . Eosinophils Absolute 11/18/2014 0.1  0 - 0.7 K/uL Final  . Basophils Relative 11/18/2014 2   Final  . Basophils Absolute 11/18/2014 0.1  0 - 0.1 K/uL Final  . Sodium 11/18/2014 134* 135 - 145 mmol/L Final  . Potassium 11/18/2014 3.6  3.5 - 5.1 mmol/L Final  . Chloride 11/18/2014 102  101 - 111 mmol/L Final  . CO2 11/18/2014 29  22 - 32 mmol/L Final  . Glucose, Bld 11/18/2014 273* 65 - 99 mg/dL Final  . BUN 11/18/2014 14  6 - 20 mg/dL Final  . Creatinine, Ser 11/18/2014 0.71  0.44 - 1.00 mg/dL Final  . Calcium 11/18/2014 8.1* 8.9 - 10.3 mg/dL Final  . Total Protein 11/18/2014 7.4  6.5 - 8.1 g/dL Final  . Albumin 11/18/2014 3.1* 3.5 - 5.0 g/dL Final  . AST 11/18/2014 38  15 - 41 U/L Final  . ALT 11/18/2014 82* 14 - 54 U/L  Final  . Alkaline Phosphatase 11/18/2014 372* 38 - 126 U/L Final  . Total Bilirubin 11/18/2014 0.8  0.3 - 1.2 mg/dL Final  . GFR calc non Af Amer 11/18/2014 >60  >60 mL/min Final  . GFR calc Af Amer 11/18/2014 >60  >60 mL/min Final   Comment: (NOTE) The eGFR has been calculated using the CKD EPI equation. This calculation has not been validated in all clinical situations. eGFR's persistently <60 mL/min signify possible Chronic Kidney Disease.   . Anion gap 11/18/2014 3* 5 - 15 Final  . 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 difficile infection cannot be ruled out   MEDICAL DECISION MAKING:  Stool for C. difficile examination Lomotil has controlled diarrhea also was advised to continue Lomotil twice a day Urine for 24 hours 5-HIAA evaluation Discussed for possibility of increasing Sandostatin LAR to 60 mg up on the insurance approval patient will get extra dose of 30 mg followed by in 4 weeks 60 mg subcutaneous Patient and family desires to be under hospice however hospice does not want to accept this patient as long as patient is on  Sandostatin LAR Total duration of visit was 40 minutes.  50% or more time was spent in counseling patient and family regarding prognosis and options of treatment and available resources  Patient expressed understanding and was in agreement with this plan. She also understands that She can call clinic at any time with any questions, concerns, or complaints.    Neuroendocrine carcinoma of colon   Staging form: Merkel Cell Carcinoma, AJCC 7th Edition     Clinical: Stage IV (TX, NX, M1b) - Signed by Forest Gleason, MD on 09/17/2014   Forest Gleason, MD   11/22/2014 4:42 PM

## 2014-11-23 ENCOUNTER — Inpatient Hospital Stay: Payer: Medicare Other

## 2014-11-23 VITALS — BP 190/63 | HR 66 | Temp 96.4°F | Resp 18

## 2014-11-23 DIAGNOSIS — C7A00 Malignant carcinoid tumor of unspecified site: Secondary | ICD-10-CM

## 2014-11-23 DIAGNOSIS — C7A8 Other malignant neuroendocrine tumors: Secondary | ICD-10-CM

## 2014-11-23 DIAGNOSIS — C7A011 Malignant carcinoid tumor of the jejunum: Secondary | ICD-10-CM | POA: Diagnosis not present

## 2014-11-23 MED ORDER — OCTREOTIDE ACETATE 30 MG IM KIT
30.0000 mg | PACK | Freq: Once | INTRAMUSCULAR | Status: AC
  Administered 2014-11-23: 30 mg via INTRAMUSCULAR
  Filled 2014-11-23: qty 1

## 2014-11-24 ENCOUNTER — Other Ambulatory Visit: Payer: Self-pay

## 2014-11-24 DIAGNOSIS — C7A011 Malignant carcinoid tumor of the jejunum: Secondary | ICD-10-CM | POA: Diagnosis not present

## 2014-11-24 DIAGNOSIS — C7A8 Other malignant neuroendocrine tumors: Secondary | ICD-10-CM

## 2014-11-26 ENCOUNTER — Other Ambulatory Visit: Payer: Self-pay | Admitting: *Deleted

## 2014-11-26 ENCOUNTER — Other Ambulatory Visit: Payer: Self-pay

## 2014-11-26 DIAGNOSIS — D3A00 Benign carcinoid tumor of unspecified site: Secondary | ICD-10-CM

## 2014-11-26 DIAGNOSIS — C7A8 Other malignant neuroendocrine tumors: Secondary | ICD-10-CM

## 2014-12-07 ENCOUNTER — Inpatient Hospital Stay: Payer: Medicare Other

## 2014-12-07 ENCOUNTER — Other Ambulatory Visit: Payer: Self-pay | Admitting: *Deleted

## 2014-12-07 DIAGNOSIS — C7A8 Other malignant neuroendocrine tumors: Secondary | ICD-10-CM

## 2014-12-07 DIAGNOSIS — C7A00 Malignant carcinoid tumor of unspecified site: Secondary | ICD-10-CM

## 2014-12-07 DIAGNOSIS — C7A011 Malignant carcinoid tumor of the jejunum: Secondary | ICD-10-CM | POA: Diagnosis not present

## 2014-12-07 MED ORDER — OCTREOTIDE ACETATE 30 MG IM KIT
30.0000 mg | PACK | Freq: Once | INTRAMUSCULAR | Status: DC
  Filled 2014-12-07: qty 1

## 2014-12-07 MED ORDER — DIPHENOXYLATE-ATROPINE 2.5-0.025 MG PO TABS: 1.0000 | ORAL_TABLET | ORAL | Status: DC | PRN

## 2014-12-07 MED ORDER — OCTREOTIDE ACETATE 30 MG IM KIT
60.0000 mg | PACK | Freq: Once | INTRAMUSCULAR | Status: AC
  Administered 2014-12-07: 60 mg via INTRAMUSCULAR

## 2014-12-29 ENCOUNTER — Telehealth: Payer: Self-pay | Admitting: *Deleted

## 2014-12-29 DIAGNOSIS — C7A8 Other malignant neuroendocrine tumors: Secondary | ICD-10-CM

## 2014-12-29 MED ORDER — DIPHENOXYLATE-ATROPINE 2.5-0.025 MG PO TABS: 1.0000 | ORAL_TABLET | ORAL | Status: DC | PRN

## 2014-12-29 NOTE — Telephone Encounter (Signed)
Referral faxed to hospice °

## 2014-12-29 NOTE — Telephone Encounter (Signed)
Sandostain inj are not working, actually, the diarrhea is worse and they are also using Lomtil TID. She is having 6 liquid stools per day. He is asking what would happen if they stop the injections since they are so expensive and she has had no improvement. Asking if she could be hospice if the injections are stopped

## 2014-12-29 NOTE — Telephone Encounter (Signed)
Rx faxed for q 6 h prn and Dan informed that per Dr Oliva Bustard, no more than 4 tabs a day

## 2014-12-29 NOTE — Telephone Encounter (Signed)
Per Dr Oliva Bustard, stop Sandostatin injections and refer to Hospice. I called Dan and informed him of this and he asked for a refill to be sent to pharmacy for Lomitil for 4 tabs a day. He is increasing the number of tabs she gats per day due to the diarrhea. Asking if this is ok and if she can be given up to 6 tabs a day of lomotil

## 2015-01-03 ENCOUNTER — Telehealth: Payer: Self-pay | Admitting: *Deleted

## 2015-01-03 DIAGNOSIS — C7A011 Malignant carcinoid tumor of the jejunum: Secondary | ICD-10-CM | POA: Diagnosis not present

## 2015-01-03 NOTE — Telephone Encounter (Signed)
Has a wound on sacrum that she had surgery on and is asking if they can have an order for Silvadene cream bid

## 2015-01-03 NOTE — Telephone Encounter (Signed)
OK to use silvadene cream. Vergie informed and will call in med to pharmacy

## 2015-01-04 ENCOUNTER — Ambulatory Visit: Payer: Medicare Other

## 2015-01-19 ENCOUNTER — Ambulatory Visit (INDEPENDENT_AMBULATORY_CARE_PROVIDER_SITE_OTHER): Payer: Medicare Other | Admitting: Internal Medicine

## 2015-01-19 ENCOUNTER — Encounter: Payer: Self-pay | Admitting: Internal Medicine

## 2015-01-19 VITALS — BP 160/80 | HR 54 | Temp 98.2°F | Wt 109.0 lb

## 2015-01-19 DIAGNOSIS — R509 Fever, unspecified: Secondary | ICD-10-CM | POA: Diagnosis not present

## 2015-01-19 DIAGNOSIS — C7A00 Malignant carcinoid tumor of unspecified site: Secondary | ICD-10-CM | POA: Diagnosis not present

## 2015-01-19 DIAGNOSIS — E119 Type 2 diabetes mellitus without complications: Secondary | ICD-10-CM

## 2015-01-19 DIAGNOSIS — E441 Mild protein-calorie malnutrition: Secondary | ICD-10-CM | POA: Diagnosis not present

## 2015-01-19 LAB — POCT URINALYSIS DIPSTICK
Glucose, UA: NEGATIVE
KETONES UA: NEGATIVE
NITRITE UA: POSITIVE
Spec Grav, UA: 1.025
UROBILINOGEN UA: NEGATIVE
pH, UA: 6

## 2015-01-19 MED ORDER — CEPHALEXIN 500 MG PO CAPS: 500.0000 mg | ORAL_CAPSULE | Freq: Three times a day (TID) | ORAL | Status: DC

## 2015-01-19 NOTE — Progress Notes (Signed)
Subjective:    Patient ID: Sophia Hill, female    DOB: 01-04-1922, 81 y.o.   MRN: [id number]  HPI Here with son and DIL Pushed up scheduled visit for tomorrow  Fever yesterday Better today No cough or breathing problems--but some cough after swallowing at times Felt chilled last night--upset stomach. Temp 100.4 DIL gave tylenol Had shaking chill when they got over to check her Nausea Hospice did check her also---settled down  Got small dose of lorazepam for nausea Got her sedated and "wacky"  Lives at [facility] and so is back on hospice ADLs assist with paid aide and hospice aide---needs help with all care  Rash on left cheek Started ~5 days ago--red and then spread ??bite Seems to come and go Seemed better after trying silvadene (may have inadvertendly had polysporin put on it--and she is allergic)  Doesn't check sugars No chest pain No dizziness  Current Outpatient Prescriptions on File Prior to Visit  Medication Sig Dispense Refill  . acetaminophen (TYLENOL) 650 MG CR tablet Take 650 mg by mouth 3 (three) times daily as needed.    Marland Kitchen amLODipine (NORVASC) 2.5 MG tablet Take 1 tablet (2.5 mg total) by mouth daily. 90 tablet 1  . aspirin 81 MG tablet Take 81 mg by mouth daily.    . Cholecalciferol (VITAMIN D) 2000 UNITS CAPS Take by mouth daily.    . cloNIDine (CATAPRES - DOSED IN MG/24 HR) 0.2 mg/24hr patch Place 1 patch (0.2 mg total) onto the skin once a week. 12 patch 1  . diphenoxylate-atropine (LOMOTIL) 2.5-0.025 MG tablet Take 1 tablet by mouth every 4 (four) hours as needed for diarrhea or loose stools. 120 tablet 1  . lactulose (CHRONULAC) 10 GM/15ML solution Take 15 mLs (10 g total) by mouth every 4 (four) hours as needed for mild constipation (until has bowel movement). 240 mL 0  . lisinopril (PRINIVIL,ZESTRIL) 20 MG tablet Take 1 tablet (20 mg total) by mouth daily. 90 tablet 1  . Multiple Vitamins-Minerals (CENTRUM SILVER ADULT 50+)  TABS Take by mouth daily.    Marland Kitchen triamcinolone ointment (KENALOG) 0.5 % Apply 1 application topically daily. 30 g 0   No current facility-administered medications on file prior to visit.    Allergies  Allergen Reactions  . Penicillin G     Other reaction(s): Unknown  . Augmentin [Amoxicillin-Pot Clavulanate] Rash  . Neosporin [Neomycin-Bacitracin Zn-Polymyx] Rash    Past Medical History  Diagnosis Date  . Hyperlipidemia   . Hypertension   . Carcinoid tumor 2005    [facility]  . Depression   . Late effects of CVA (cerebrovascular accident)   . Osteoarthritis, multiple sites   . Anemia     iron deficiency in the past  . Type II or unspecified type diabetes mellitus without mention of complication, not stated as uncontrolled     Past Surgical History  Procedure Laterality Date  . Tonsillectomy    . Colon resection  2005    for carcinoid  . Hemorrhoid surgery      Family History  Problem Relation Age of Onset  . Stroke Mother   . Hypertension Mother   . Cancer Father   . Heart disease Father   . Diabetes Brother     Social History   Social History  . Marital Status: Widowed    Spouse Name: N/A  . Number of Children: 2  . Years of Education: N/A   Occupational  History  . Retired-- [employer] History Main Topics  . Smoking status: Never Smoker   . Smokeless tobacco: Never Used  . Alcohol Use: No  . Drug Use: No  . Sexual Activity: Not on file   Other Topics Concern  . Not on file   Social History Narrative   Has living will   Son is health care POA   Requests DNR--done 11/02/11   No tube feeds if cognitively unaware   Review of Systems Appetite is good Has lost some weight though--down 5# recently Diarrhea is actually better off the sandostatin. Uses the lomotil    Objective:   Physical Exam  Constitutional:  Clear weight loss frail  HENT:  Mouth/Throat: Oropharynx is clear and moist. No  oropharyngeal exudate.  Neck: No thyromegaly present.  Cardiovascular: Normal rate and regular rhythm.  Exam reveals no gallop.   Gr 2/6 MR murmur  Pulmonary/Chest: Effort normal and breath sounds normal. No respiratory distress. She has no wheezes. She has no rales.  Abdominal: Soft. She exhibits no distension. There is no tenderness. There is no rebound and no guarding.  Musculoskeletal: She exhibits no edema.  Lymphadenopathy:    She has no cervical adenopathy.  Skin:  Sallow complexion but not jaundiced No infection on cheek--nonspecific redness  Psychiatric:  Mild psychomotor retardation          Assessment & Plan:

## 2015-01-19 NOTE — Assessment & Plan Note (Addendum)
Last night--with rigors, etc Better this morning but overall status has declined No specific urinary or respiratory symptoms Skin okay ?related to carcinoid?  Urinalysis is abnormal--will send culture and start empiric Rx

## 2015-01-19 NOTE — Assessment & Plan Note (Signed)
Clearly progressing On hospice

## 2015-01-19 NOTE — Assessment & Plan Note (Signed)
Given weight loss, deterioration and weight loss--- can liberalize diet and hold off on labs

## 2015-01-19 NOTE — Assessment & Plan Note (Signed)
Eats fairly well but losing weight

## 2015-01-20 ENCOUNTER — Ambulatory Visit: Payer: Medicare Other | Admitting: Internal Medicine

## 2015-01-21 LAB — URINE CULTURE: Colony Count: >=100000

## 2015-01-21 LAB — STOOL CULTURE

## 2015-01-22 ENCOUNTER — Other Ambulatory Visit: Payer: Self-pay | Admitting: Internal Medicine

## 2015-01-24 ENCOUNTER — Other Ambulatory Visit: Payer: Self-pay | Admitting: *Deleted

## 2015-01-24 ENCOUNTER — Inpatient Hospital Stay: Attending: Oncology | Admitting: Oncology

## 2015-01-24 ENCOUNTER — Encounter: Payer: Self-pay | Admitting: Oncology

## 2015-01-24 DIAGNOSIS — R410 Disorientation, unspecified: Secondary | ICD-10-CM | POA: Diagnosis not present

## 2015-01-24 DIAGNOSIS — C7A011 Malignant carcinoid tumor of the jejunum: Secondary | ICD-10-CM | POA: Diagnosis not present

## 2015-01-24 DIAGNOSIS — F0390 Unspecified dementia without behavioral disturbance: Secondary | ICD-10-CM | POA: Diagnosis not present

## 2015-01-24 DIAGNOSIS — R531 Weakness: Secondary | ICD-10-CM | POA: Insufficient documentation

## 2015-01-24 DIAGNOSIS — R197 Diarrhea, unspecified: Secondary | ICD-10-CM | POA: Insufficient documentation

## 2015-01-24 DIAGNOSIS — Z79899 Other long term (current) drug therapy: Secondary | ICD-10-CM | POA: Insufficient documentation

## 2015-01-24 DIAGNOSIS — C7A8 Other malignant neuroendocrine tumors: Secondary | ICD-10-CM

## 2015-01-24 DIAGNOSIS — C7A00 Malignant carcinoid tumor of unspecified site: Secondary | ICD-10-CM

## 2015-01-24 MED ORDER — PROMETHAZINE HCL 25 MG PO TABS: 25.0000 mg | ORAL_TABLET | ORAL | Status: DC | PRN

## 2015-01-24 NOTE — Progress Notes (Signed)
Patient with neuroendocrine tumor.  Entire time was spent today discussing situation with family.  Patient is under hospice care at present time.  Family is concerned square Lomotil controls diarrhea but makes her feel weak and confused Family has noticed jaundice patient was seen by primary care physician Jannifer Franklin check urine analysis and put patient on no antibiotic therapy without much help Family is concerned about ongoing care in independent  Living facility. Son is somewhat concerned about general status of the patient  I had prolonged discussion with patient and family that most of his symptoms that describe are secondary to progressive neuroendocrine tumor however whether there is a metastases to the liver or not or whether patient is jaundice or not only can be decided by doing further x-rays scanning and blood testing As far as her diarrhea if Lomotil controls the diarrhea once and continue to use. Other options would be that if you find liver metastases then radiofrequency ablation or resection of liver lesion sometime helps to control diarrhea but considering patient's old age and early dementia family is not eager to put her through all this workup at present time  If needed time to time IV hydration can be considered Entire 20 minute was spent discussing with the patient overall course of the disease.  Continuing planning to control diarrhea and symptoms and continuing discussion with hospice if needed patient will be evaluated by me

## 2015-02-01 ENCOUNTER — Ambulatory Visit: Payer: Medicare Other

## 2015-02-21 ENCOUNTER — Other Ambulatory Visit: Payer: Self-pay | Admitting: Internal Medicine

## 2015-02-22 ENCOUNTER — Ambulatory Visit (INDEPENDENT_AMBULATORY_CARE_PROVIDER_SITE_OTHER): Payer: Medicare Other | Admitting: Internal Medicine

## 2015-02-22 ENCOUNTER — Encounter: Payer: Self-pay | Admitting: Internal Medicine

## 2015-02-22 ENCOUNTER — Telehealth: Payer: Self-pay | Admitting: *Deleted

## 2015-02-22 VITALS — BP 140/60 | HR 73 | Temp 98.4°F | Wt 109.0 lb

## 2015-02-22 DIAGNOSIS — E119 Type 2 diabetes mellitus without complications: Secondary | ICD-10-CM

## 2015-02-22 DIAGNOSIS — C7A00 Malignant carcinoid tumor of unspecified site: Secondary | ICD-10-CM

## 2015-02-22 DIAGNOSIS — C7A8 Other malignant neuroendocrine tumors: Secondary | ICD-10-CM

## 2015-02-22 DIAGNOSIS — E441 Mild protein-calorie malnutrition: Secondary | ICD-10-CM | POA: Diagnosis not present

## 2015-02-22 DIAGNOSIS — IMO0001 Reserved for inherently not codable concepts without codable children: Secondary | ICD-10-CM | POA: Insufficient documentation

## 2015-02-22 DIAGNOSIS — I700 Atherosclerosis of aorta: Secondary | ICD-10-CM | POA: Diagnosis not present

## 2015-02-22 MED ORDER — DIPHENOXYLATE-ATROPINE 2.5-0.025 MG PO TABS: 1.0000 | ORAL_TABLET | ORAL | Status: DC | PRN

## 2015-02-22 NOTE — Assessment & Plan Note (Signed)
Vs stenosis No symptoms ?related to carcinoid No action since no symptoms and wouldn't treat

## 2015-02-22 NOTE — Progress Notes (Signed)
Pre visit review using our clinic review tool, if applicable. No additional management support is needed unless otherwise documented below in the visit note. 

## 2015-02-22 NOTE — Assessment & Plan Note (Signed)
We are not monitoring this given her limited prognosis

## 2015-02-22 NOTE — Telephone Encounter (Signed)
faxed

## 2015-02-22 NOTE — Assessment & Plan Note (Signed)
Holding her own at this point

## 2015-02-22 NOTE — Progress Notes (Signed)
Subjective:    Patient ID: Sophia Hill, female    DOB: 04-Feb-1922, 81 y.o.   MRN: [id number]  HPI Here for follow up of carcinoid No longer with fever Ongoing jaundice---reviewed Dr Metro Kung evaluation Was on hospice briefly--but then taken off due to stability  Eating well Weight is stable She is still in her own place Daily AM aide for personal care Son monitors meds [facility] provides the meals  Taking 6 lomotil daily This controls the diarrhea reasonably No abdominal pain  Mood has been okay No depression or sadness  Not checking sugars We have liberalized diet to promote nutrition She does avoid sweets  No dizziness No chest pain Breathing is okay  Current Outpatient Prescriptions on File Prior to Visit  Medication Sig Dispense Refill  . amLODipine (NORVASC) 2.5 MG tablet TAKE 1 TABLET BY MOUTH ONCE DAILY. 90 tablet 3  . aspirin 81 MG tablet Take 81 mg by mouth daily.    . Cholecalciferol (VITAMIN D) 2000 UNITS CAPS Take by mouth daily.    . cloNIDine (CATAPRES - DOSED IN MG/24 HR) 0.2 mg/24hr patch APPLY 1 PATCH TOPICALLY ONCE WEEKLY. REMOVE OLD PATCH AND REPLACE WITH NEW PATCH EVERY 7 DAYS. 12 patch 3  . lisinopril (PRINIVIL,ZESTRIL) 20 MG tablet TAKE 1 TABLET BY MOUTH ONCE DAILY. 90 tablet PRN  . Multiple Vitamins-Minerals (CENTRUM SILVER ADULT 50+) TABS Take by mouth daily.     No current facility-administered medications on file prior to visit.    Allergies  Allergen Reactions  . Penicillin G     Other reaction(s): Unknown  . Augmentin [Amoxicillin-Pot Clavulanate] Rash  . Neosporin [Neomycin-Bacitracin Zn-Polymyx] Rash    Past Medical History  Diagnosis Date  . Hyperlipidemia   . Hypertension   . Carcinoid tumor 2005    [facility]  . Depression   . Late effects of CVA (cerebrovascular accident)   . Osteoarthritis, multiple sites   . Anemia     iron deficiency in the past  . Type II or unspecified  type diabetes mellitus without mention of complication, not stated as uncontrolled     Past Surgical History  Procedure Laterality Date  . Tonsillectomy    . Colon resection  2005    for carcinoid  . Hemorrhoid surgery      Family History  Problem Relation Age of Onset  . Stroke Mother   . Hypertension Mother   . Cancer Father   . Heart disease Father   . Diabetes Brother     Social History   Social History  . Marital Status: Widowed    Spouse Name: N/A  . Number of Children: 2  . Years of Education: N/A   Occupational History  . Retired-- [employer] History Main Topics  . Smoking status: Never Smoker   . Smokeless tobacco: Never Used  . Alcohol Use: No  . Drug Use: No  . Sexual Activity: Not on file   Other Topics Concern  . Not on file   Social History Narrative   Has living will   Son is health care POA   Requests DNR--done 11/02/11   No tube feeds if cognitively unaware   Review of Systems  Sleeping fine No pain issues Some degree of fatigue Recent derm eval-- some precancers removed     Objective:   Physical Exam  Constitutional: She appears well-developed. No distress.  Neck: Normal range of motion. Neck supple.  No thyromegaly present.  Cardiovascular: Normal rate and regular rhythm.  Exam reveals no gallop.   Gr 2/6 aortic systolic murmur  Musculoskeletal: She exhibits no edema.  Lymphadenopathy:    She has no cervical adenopathy.  Skin:  Moderate jaundice  Psychiatric: She has a normal mood and affect. Her behavior is normal.          Assessment & Plan:

## 2015-02-22 NOTE — Assessment & Plan Note (Signed)
Presumed mets to liver with moderate jaundice No Rx now

## 2015-03-16 ENCOUNTER — Ambulatory Visit: Payer: Medicare Other | Admitting: Oncology

## 2015-03-16 ENCOUNTER — Ambulatory Visit: Payer: Medicare Other

## 2015-03-16 ENCOUNTER — Other Ambulatory Visit: Payer: Medicare Other

## 2015-03-18 ENCOUNTER — Telehealth: Payer: Self-pay | Admitting: *Deleted

## 2015-03-18 NOTE — Telephone Encounter (Signed)
This is not due for refill

## 2015-04-07 ENCOUNTER — Ambulatory Visit: Payer: Medicare Other | Admitting: Oncology

## 2015-04-07 ENCOUNTER — Other Ambulatory Visit: Payer: Medicare Other

## 2015-04-12 ENCOUNTER — Telehealth: Payer: Self-pay | Admitting: *Deleted

## 2015-04-12 DIAGNOSIS — C7A8 Other malignant neuroendocrine tumors: Secondary | ICD-10-CM

## 2015-04-12 MED ORDER — DIPHENOXYLATE-ATROPINE 2.5-0.025 MG PO TABS: 1.0000 | ORAL_TABLET | ORAL | Status: DC | PRN

## 2015-04-12 NOTE — Telephone Encounter (Signed)
Escribed

## 2015-04-19 ENCOUNTER — Inpatient Hospital Stay: Payer: Medicare Other

## 2015-04-19 ENCOUNTER — Encounter: Payer: Self-pay | Admitting: Oncology

## 2015-04-19 ENCOUNTER — Inpatient Hospital Stay: Payer: Medicare Other | Attending: Oncology | Admitting: Oncology

## 2015-04-19 VITALS — BP 176/77 | HR 92 | Temp 97.6°F | Wt 106.2 lb

## 2015-04-19 DIAGNOSIS — R5383 Other fatigue: Secondary | ICD-10-CM | POA: Diagnosis not present

## 2015-04-19 DIAGNOSIS — M1990 Unspecified osteoarthritis, unspecified site: Secondary | ICD-10-CM | POA: Insufficient documentation

## 2015-04-19 DIAGNOSIS — C7A8 Other malignant neuroendocrine tumors: Secondary | ICD-10-CM

## 2015-04-19 DIAGNOSIS — E785 Hyperlipidemia, unspecified: Secondary | ICD-10-CM | POA: Diagnosis not present

## 2015-04-19 DIAGNOSIS — Z8673 Personal history of transient ischemic attack (TIA), and cerebral infarction without residual deficits: Secondary | ICD-10-CM | POA: Insufficient documentation

## 2015-04-19 DIAGNOSIS — I10 Essential (primary) hypertension: Secondary | ICD-10-CM | POA: Diagnosis not present

## 2015-04-19 DIAGNOSIS — H9190 Unspecified hearing loss, unspecified ear: Secondary | ICD-10-CM | POA: Diagnosis not present

## 2015-04-19 DIAGNOSIS — R61 Generalized hyperhidrosis: Secondary | ICD-10-CM | POA: Insufficient documentation

## 2015-04-19 DIAGNOSIS — R413 Other amnesia: Secondary | ICD-10-CM | POA: Diagnosis not present

## 2015-04-19 DIAGNOSIS — D72829 Elevated white blood cell count, unspecified: Secondary | ICD-10-CM | POA: Diagnosis not present

## 2015-04-19 DIAGNOSIS — R197 Diarrhea, unspecified: Secondary | ICD-10-CM | POA: Diagnosis not present

## 2015-04-19 DIAGNOSIS — Z79899 Other long term (current) drug therapy: Secondary | ICD-10-CM | POA: Insufficient documentation

## 2015-04-19 DIAGNOSIS — C7A011 Malignant carcinoid tumor of the jejunum: Secondary | ICD-10-CM | POA: Diagnosis not present

## 2015-04-19 DIAGNOSIS — R630 Anorexia: Secondary | ICD-10-CM | POA: Diagnosis not present

## 2015-04-19 DIAGNOSIS — R531 Weakness: Secondary | ICD-10-CM

## 2015-04-19 DIAGNOSIS — Z7982 Long term (current) use of aspirin: Secondary | ICD-10-CM | POA: Insufficient documentation

## 2015-04-19 DIAGNOSIS — E119 Type 2 diabetes mellitus without complications: Secondary | ICD-10-CM | POA: Diagnosis not present

## 2015-04-19 LAB — CBC WITH DIFFERENTIAL/PLATELET
Basophils Absolute: 0.1 10*3/uL (ref 0–0.1)
Basophils Relative: 0 %
EOS ABS: 0.1 10*3/uL (ref 0–0.7)
Eosinophils Relative: 1 %
HEMATOCRIT: 29.4 % — AB (ref 35.0–47.0)
HEMOGLOBIN: 9.6 g/dL — AB (ref 12.0–16.0)
LYMPHS ABS: 1.7 10*3/uL (ref 1.0–3.6)
Lymphocytes Relative: 14 %
MCH: 26.2 pg (ref 26.0–34.0)
MCHC: 32.8 g/dL (ref 32.0–36.0)
MCV: 79.9 fL — ABNORMAL LOW (ref 80.0–100.0)
MONO ABS: 0.8 10*3/uL (ref 0.2–0.9)
MONOS PCT: 6 %
NEUTROS PCT: 79 %
Neutro Abs: 9.7 10*3/uL — ABNORMAL HIGH (ref 1.4–6.5)
Platelets: 388 10*3/uL (ref 150–440)
RBC: 3.68 MIL/uL — ABNORMAL LOW (ref 3.80–5.20)
RDW: 16.5 % — AB (ref 11.5–14.5)
WBC: 12.3 10*3/uL — ABNORMAL HIGH (ref 3.6–11.0)

## 2015-04-19 LAB — COMPREHENSIVE METABOLIC PANEL
ALBUMIN: 2.9 g/dL — AB (ref 3.5–5.0)
ALK PHOS: 549 U/L — AB (ref 38–126)
ALT: 42 U/L (ref 14–54)
AST: 31 U/L (ref 15–41)
Anion gap: 7 (ref 5–15)
BUN: 18 mg/dL (ref 6–20)
CALCIUM: 9 mg/dL (ref 8.9–10.3)
CHLORIDE: 97 mmol/L — AB (ref 101–111)
CO2: 29 mmol/L (ref 22–32)
CREATININE: 0.56 mg/dL (ref 0.44–1.00)
GFR calc non Af Amer: >60 mL/min (ref >60)
GLUCOSE: 145 mg/dL — AB (ref 65–99)
Potassium: 4 mmol/L (ref 3.5–5.1)
SODIUM: 133 mmol/L — AB (ref 135–145)
Total Bilirubin: 0.7 mg/dL (ref 0.3–1.2)
Total Protein: 7.9 g/dL (ref 6.5–8.1)

## 2015-04-19 NOTE — Progress Notes (Signed)
[place] @ [facility] [phone]  Fax:([phone]) [place]: 07/28/1921  MR#: [id number]  WNU#:[id number]  Patient Care Team: Venia Carbon, MD as PCP - General (Pediatrics)  CHIEF COMPLAINT:  Chief Complaint  Patient presents with  . Colon Cancer   Oncology History   1. 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 MIBG scan in December of 2008 revealed suspicious metastatic disease and this was started on Sandostatin LAR carefully monitored every 6 months 3.  Patient relocated to [place], [place] in an assisted living facility. Was previously under home hospice care, hospice has now discharged her from care.         INTERVAL HISTORY:  82 year old lady with stage IV neuroendocrine tumor with carcinoid syndrome.  Patient is on Sandostatin LAR Patient continues to have intermittent diarrhea. No abdominal pain no nausea no vomiting no significant weight loss Recently had noticed high blood pressure patient is on multiple high blood pressure medication being managed by primary care physician. Patient was referred to hospice but because of stability patient was discharged from the hospice care Over period of  last few weeks patient and intercurrent infection. Low grade fever has been treated with antibiotics.  Family reports that patient has an night sweats Poor appetite as lost weight Also patient had acute hearing loss. Progressive memory loss       Patient had "a bug" about 3 weeks ago. Patient is accompanied by son today. He states he has noticed a difference in patient since she was sick. Her appetite has decreased. Memory has declined. Also her hearing has declined. Patient also having night sweats so much that she has to change clothes. Patient taking a total of 5 imodium every day. Stools are still loose even after imodium. Son states he has not noticed any difference since stopping her Sandostatin  injections.    REVIEW OF SYSTEMS:    general status: Patient is feeling weak and tired.  No change in a performance status.  No chills.  No fever. HEENT:   No evidence of stomatitis Lungs: No cough or shortness of breath Cardiac: No chest pain or paroxysmal nocturnal dyspnea GI: As per history of present illness Skin: No rash Lower extremity no swelling Neurological system: No tingling.  No numbness.  No other focal signs Musculoskeletal system no bony pains .  Family is quite concerned with episodes of diarrhea   particularly systolic blood pressure is slightly high As per HPI. Otherwise, a complete review of systems is negatve.  PAST MEDICAL HISTORY: Past Medical History  Diagnosis Date  . Hyperlipidemia   . Hypertension   . Carcinoid tumor 2005    [facility]  . Depression   . Late effects of CVA (cerebrovascular accident)   . Osteoarthritis, multiple sites   . Anemia     iron deficiency in the past  . Type II or unspecified type diabetes mellitus without mention of complication, not stated as uncontrolled     PAST SURGICAL HISTORY: Past Surgical History  Procedure Laterality Date  . Tonsillectomy    . Colon resection  2005    for carcinoid  . Hemorrhoid surgery      FAMILY HISTORY Family History  Problem Relation Age of Onset  . Stroke Mother   . Hypertension Mother   . Cancer Father   . Heart disease Father   . Diabetes Brother  ADVANCED DIRECTIVES:  Patient does have advance healthcare directive, Patient   does not desire to make any changes HEALTH MAINTENANCE: Social History  Substance Use Topics  . Smoking status: Never Smoker   . Smokeless tobacco: Never Used  . Alcohol Use: No      Allergies  Allergen Reactions  . Penicillin G     Other reaction(s): Unknown  . Augmentin [Amoxicillin-Pot Clavulanate] Rash  . Neosporin [Neomycin-Bacitracin Zn-Polymyx] Rash    Current Outpatient Prescriptions    Medication Sig Dispense Refill  . acetaminophen (TYLENOL 8 HOUR ARTHRITIS PAIN) 650 MG CR tablet Take 1,300 mg by mouth every 8 (eight) hours as needed for pain.    Marland Kitchen amLODipine (NORVASC) 2.5 MG tablet TAKE 1 TABLET BY MOUTH ONCE DAILY. 90 tablet 3  . aspirin 81 MG tablet Take 81 mg by mouth daily.    . Cholecalciferol (VITAMIN D) 2000 UNITS CAPS Take by mouth daily.    . cloNIDine (CATAPRES - DOSED IN MG/24 HR) 0.2 mg/24hr patch APPLY 1 PATCH TOPICALLY ONCE WEEKLY. REMOVE OLD PATCH AND REPLACE WITH NEW PATCH EVERY 7 DAYS. 12 patch 3  . diphenoxylate-atropine (LOMOTIL) 2.5-0.025 MG tablet Take 1 tablet by mouth every 4 (four) hours as needed for diarrhea or loose stools. 120 tablet 1  . lisinopril (PRINIVIL,ZESTRIL) 20 MG tablet TAKE 1 TABLET BY MOUTH ONCE DAILY. 90 tablet PRN  . Multiple Vitamins-Minerals (CENTRUM SILVER ADULT 50+) TABS Take by mouth daily.     No current facility-administered medications for this visit.    OBJECTIVE:  Filed Vitals:   04/19/15 1426  BP: 176/77  Pulse: 92  Temp: 97.6 F (36.4 C)     Body mass index is 17.15 kg/(m^2).    ECOG FS:1 - Symptomatic but completely ambulatory  PHYSICAL EXAM: Gen. status: Patient is alert oriented not any acute distress Lymphatic system: Supraclavicular, cervical, axillary, inguinal lymph nodes are not palpable Cardiac exam revealed the PMI to be normally situated and sized. The rhythm was regular and no extrasystoles were noted during several minutes of auscultation. The first and second heart sounds were normal and physiologic splitting of the second heart sound was noted. There were no murmurs, rubs, clicks, or gallops. Examination of the chest was unremarkable. There were no bony deformities, no asymmetry, and no other abnormalities. Abdomen: Soft no palpable masses no ascites slight tenderness in lower quadrant bowel sounds are present Examination of the skin revealed no evidence of significant rashes, suspicious  appearing nevi or other concerning lesions. Neurologically, the patient was awake, alert, and oriented to person, place and time. There were no obvious focal neurologic abnormalities.   LAB RESULTS:  Appointment on 04/19/2015  Component Date Value Ref Range Status  . Sodium 04/19/2015 133* 135 - 145 mmol/L Final  . Potassium 04/19/2015 4.0  3.5 - 5.1 mmol/L Final  . Chloride 04/19/2015 97* 101 - 111 mmol/L Final  . CO2 04/19/2015 29  22 - 32 mmol/L Final  . Glucose, Bld 04/19/2015 145* 65 - 99 mg/dL Final  . BUN 04/19/2015 18  6 - 20 mg/dL Final  . Creatinine, Ser 04/19/2015 0.56  0.44 - 1.00 mg/dL Final  . Calcium 04/19/2015 9.0  8.9 - 10.3 mg/dL Final  . Total Protein 04/19/2015 7.9  6.5 - 8.1 g/dL Final  . Albumin 04/19/2015 2.9* 3.5 - 5.0 g/dL Final  . AST 04/19/2015 31  15 - 41 U/L Final  . ALT 04/19/2015 42  14 - 54 U/L Final  . Alkaline Phosphatase  04/19/2015 549* 38 - 126 U/L Final  . Total Bilirubin 04/19/2015 0.7  0.3 - 1.2 mg/dL Final  . GFR calc non Af Amer 04/19/2015 >60  >60 mL/min Final  . GFR calc Af Amer 04/19/2015 >60  >60 mL/min Final   Comment: (NOTE) The eGFR has been calculated using the CKD EPI equation. This calculation has not been validated in all clinical situations. eGFR's persistently <60 mL/min signify possible Chronic Kidney Disease.   . Anion gap 04/19/2015 7  5 - 15 Final  . WBC 04/19/2015 12.3* 3.6 - 11.0 K/uL Final  . RBC 04/19/2015 3.68* 3.80 - 5.20 MIL/uL Final  . Hemoglobin 04/19/2015 9.6* 12.0 - 16.0 g/dL Final  . HCT 04/19/2015 29.4* 35.0 - 47.0 % Final  . MCV 04/19/2015 79.9* 80.0 - 100.0 fL Final  . MCH 04/19/2015 26.2  26.0 - 34.0 pg Final  . MCHC 04/19/2015 32.8  32.0 - 36.0 g/dL Final  . RDW 04/19/2015 16.5* 11.5 - 14.5 % Final  . Platelets 04/19/2015 388  150 - 440 K/uL Final  . Neutrophils Relative % 04/19/2015 79   Final  . Neutro Abs 04/19/2015 9.7* 1.4 - 6.5 K/uL Final  . Lymphocytes Relative 04/19/2015 14   Final  . Lymphs  Abs 04/19/2015 1.7  1.0 - 3.6 K/uL Final  . Monocytes Relative 04/19/2015 6   Final  . Monocytes Absolute 04/19/2015 0.8  0.2 - 0.9 K/uL Final  . Eosinophils Relative 04/19/2015 1   Final  . Eosinophils Absolute 04/19/2015 0.1  0 - 0.7 K/uL Final  . Basophils Relative 04/19/2015 0   Final  . 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 Carcinoid syndrome With 5 tablets of Lomotil by mouth diarrhea is controlled Weight loss rising alkaline phosphatase suggest progressive cancer Acute hearing loss needs to be evaluated by ENT physician Ear  examination does not reveal any obvious cause There is partial blockage due to your wax  Leukocytosis Possibility of intercurrent infection family wants to hold off any antibiotics as seen had antibiotic few days ago Reevaluate patient in 6 months or before if there are progressive symptoms Family does not feel like they want to continue any specific treatment for neuroendocrine tumor    MEDICAL DECISION MAKING:    Patient expressed understanding and was in agreement with this plan. She also understands that She can call clinic at any time with any questions, concerns, or complaints.    Neuroendocrine carcinoma of colon   Staging form: Merkel Cell Carcinoma, AJCC 7th Edition     Clinical: Stage IV (TX, NX, M1b) - Signed by Forest Gleason, MD on 09/17/2014   Forest Gleason, MD   04/19/2015 3:45 PM

## 2015-04-19 NOTE — Progress Notes (Signed)
Patient had "a bug" about 3 weeks ago.  Patient is accompanied by son today.  He states he has noticed a difference in patient since she was sick.  Her appetite has decreased.  Memory has declined.  Also her hearing has declined.  Patient also having night sweats so much that she has to change clothes.  Patient taking a total of 5 imodium every day.  Stools are still loose even after imodium.  Son states he has not noticed any difference since stopping her Sandostatin injections.

## 2015-05-04 ENCOUNTER — Other Ambulatory Visit: Payer: Self-pay | Admitting: *Deleted

## 2015-05-04 ENCOUNTER — Telehealth: Payer: Self-pay | Admitting: Internal Medicine

## 2015-05-04 NOTE — Telephone Encounter (Signed)
Too early to fill

## 2015-05-04 NOTE — Telephone Encounter (Signed)
Patient Name: Sophia Hill DOB: 02/26/1922 Initial Comment Caller states his mother is having severe right arm/ shoulder pain. Hx corcinoid tumor and stroke. Nurse Assessment Nurse: Vallery Sa, RN, Cathy Date/Time (Eastern Time): 05/04/2015 2:43:21 PM Confirm and document reason for call. If symptomatic, describe symptoms. You must click the next button to save text entered. ---Caller states his mother developed severe right shoulder and arm pain last night. No chest pain or breathing difficulty. No injury in the past 3 days. No fever. Alert and responsive. Has the patient traveled out of the country within the last 30 days? ---Not Applicable Does the patient have any new or worsening symptoms? ---Yes Will a triage be completed? ---Yes Related visit to physician within the last 2 weeks? ---Yes Does the PT have any chronic conditions? (i.e. diabetes, asthma, etc.) ---Yes List chronic conditions. ---Dementia, High Blood Pressure, Cancer, Stroke in the past Is this a behavioral health or substance abuse call? ---No Guidelines Guideline Title Affirmed Question Affirmed Notes Shoulder Pain [1] Age > 88 AND [2] no obvious cause AND [3] pain even when not moving the arm (Exception: pain is clearly made worse by moving arm or bending neck) Final Disposition User Go to ED Now Vallery Sa, RN, [place] declined the Go to ER disposition and requests to have his mother seen at the office. Called the office backline and notified [facility] and [facility] [place]. Connected Dee with [facility]. Referrals GO TO FACILITY REFUSED Disagree/Comply: Disagree Disagree/Comply Reason: Disagree with instructions

## 2015-05-04 NOTE — Telephone Encounter (Signed)
Spoke with son and they scheduled appt with kate clark tomorrow at 10am, I advised dan and his wife if anything changes to seek emergency care.

## 2015-05-05 ENCOUNTER — Ambulatory Visit: Payer: Medicare Other | Admitting: Primary Care

## 2015-05-05 NOTE — Telephone Encounter (Signed)
Dan Shelvin returned you call Best number [phone]

## 2015-05-05 NOTE — Telephone Encounter (Signed)
Left message for son to return my call

## 2015-05-05 NOTE — Telephone Encounter (Signed)
I don't see any visit Find out what happeneded

## 2015-05-05 NOTE — Telephone Encounter (Signed)
Okay that is fine

## 2015-05-05 NOTE — Telephone Encounter (Signed)
Spoke with son and pt is doing better after taking aleve, pt and son will discuss on next OV 05/24/2015.

## 2015-05-24 ENCOUNTER — Encounter: Payer: Self-pay | Admitting: Internal Medicine

## 2015-05-24 ENCOUNTER — Ambulatory Visit (INDEPENDENT_AMBULATORY_CARE_PROVIDER_SITE_OTHER): Payer: Medicare Other | Admitting: Internal Medicine

## 2015-05-24 VITALS — BP 142/60 | HR 78 | Temp 98.0°F | Wt 104.8 lb

## 2015-05-24 DIAGNOSIS — I152 Hypertension secondary to endocrine disorders: Secondary | ICD-10-CM

## 2015-05-24 DIAGNOSIS — I700 Atherosclerosis of aorta: Secondary | ICD-10-CM

## 2015-05-24 DIAGNOSIS — F39 Unspecified mood [affective] disorder: Secondary | ICD-10-CM

## 2015-05-24 DIAGNOSIS — C7B00 Secondary carcinoid tumors, unspecified site: Secondary | ICD-10-CM | POA: Insufficient documentation

## 2015-05-24 DIAGNOSIS — IMO0001 Reserved for inherently not codable concepts without codable children: Secondary | ICD-10-CM

## 2015-05-24 DIAGNOSIS — E119 Type 2 diabetes mellitus without complications: Secondary | ICD-10-CM

## 2015-05-24 DIAGNOSIS — E441 Mild protein-calorie malnutrition: Secondary | ICD-10-CM

## 2015-05-24 LAB — CBC WITH DIFFERENTIAL/PLATELET
Basophils Absolute: 0.1 10*3/uL (ref 0.0–0.1)
Basophils Relative: 0.6 % (ref 0.0–3.0)
Eosinophils Absolute: 0.1 10*3/uL (ref 0.0–0.7)
Eosinophils Relative: 1.2 % (ref 0.0–5.0)
HCT: 28.3 % — ABNORMAL LOW (ref 36.0–46.0)
Hemoglobin: 9 g/dL — ABNORMAL LOW (ref 12.0–15.0)
Lymphocytes Relative: 19.7 % (ref 12.0–46.0)
Lymphs Abs: 1.6 10*3/uL (ref 0.7–4.0)
MCHC: 31.9 g/dL (ref 30.0–36.0)
MCV: 76.6 fl — ABNORMAL LOW (ref 78.0–100.0)
Monocytes Absolute: 0.6 10*3/uL (ref 0.1–1.0)
Monocytes Relative: 7.9 % (ref 3.0–12.0)
Neutro Abs: 5.7 10*3/uL (ref 1.4–7.7)
Neutrophils Relative %: 70.6 % (ref 43.0–77.0)
Platelets: 348 10*3/uL (ref 150.0–400.0)
RBC: 3.69 Mil/uL — ABNORMAL LOW (ref 3.87–5.11)
RDW: 16.5 % — ABNORMAL HIGH (ref 11.5–15.5)
WBC: 8.1 10*3/uL (ref 4.0–10.5)

## 2015-05-24 LAB — COMPREHENSIVE METABOLIC PANEL
ALBUMIN: 3.2 g/dL — AB (ref 3.5–5.2)
ALT: 25 U/L (ref 0–35)
AST: 27 U/L (ref 0–37)
Alkaline Phosphatase: 203 U/L — ABNORMAL HIGH (ref 39–117)
BUN: 18 mg/dL (ref 6–23)
CALCIUM: 9.4 mg/dL (ref 8.4–10.5)
CHLORIDE: 98 meq/L (ref 96–112)
CO2: 30 meq/L (ref 19–32)
Creatinine, Ser: 0.62 mg/dL (ref 0.40–1.20)
GFR: 95.78 mL/min (ref >60.00)
Glucose, Bld: 113 mg/dL — ABNORMAL HIGH (ref 70–99)
Potassium: 4.3 mEq/L (ref 3.5–5.1)
Sodium: 138 mEq/L (ref 135–145)
Total Bilirubin: 0.4 mg/dL (ref 0.2–1.2)
Total Protein: 7.2 g/dL (ref 6.0–8.3)

## 2015-05-24 LAB — HEMOGLOBIN A1C: Hgb A1c MFr Bld: 6.4 % (ref 4.6–6.5)

## 2015-05-24 LAB — T4, FREE: FREE T4: 1.02 ng/dL (ref 0.60–1.60)

## 2015-05-24 NOTE — Assessment & Plan Note (Signed)
Eats fairly well but still slow decline

## 2015-05-24 NOTE — Progress Notes (Signed)
Pre visit review using our clinic review tool, if applicable. No additional management support is needed unless otherwise documented below in the visit note. 

## 2015-05-24 NOTE — Assessment & Plan Note (Signed)
No apparent symptoms No sure if carcinoid could have affected valve--but no testing since no action being considered

## 2015-05-24 NOTE — Progress Notes (Signed)
Subjective:    Patient ID: Sophia Hill, female    DOB: November 29, 1921, 82 y.o.   MRN: [id number]  HPI Here with son/DIL for follow up of carcinoid tumor and other medical conditions  Doing "pretty well for an old lady" Still at [facility] Now has aide in morning and evening Aides handle laundry  She eats okay--but not thrilled with the food Still does okay for breakfast--but leaving some food at other meals Weight down slightly  Diarrhea seems better--not having the same "blow outs" Still on the lomotil No longer on Rx with sandostatin No pain  No SOB No chest pain No dizziness or syncope  Not checking sugars No leg numbness or pain--just weak  Current Outpatient Prescriptions on File Prior to Visit  Medication Sig Dispense Refill  . acetaminophen (TYLENOL 8 HOUR ARTHRITIS PAIN) 650 MG CR tablet Take 1,300 mg by mouth every 8 (eight) hours as needed for pain.    Marland Kitchen amLODipine (NORVASC) 2.5 MG tablet TAKE 1 TABLET BY MOUTH ONCE DAILY. 90 tablet 3  . aspirin 81 MG tablet Take 81 mg by mouth daily.    . cloNIDine (CATAPRES - DOSED IN MG/24 HR) 0.2 mg/24hr patch APPLY 1 PATCH TOPICALLY ONCE WEEKLY. REMOVE OLD PATCH AND REPLACE WITH NEW PATCH EVERY 7 DAYS. 12 patch 3  . diphenoxylate-atropine (LOMOTIL) 2.5-0.025 MG tablet Take 1 tablet by mouth every 4 (four) hours as needed for diarrhea or loose stools. 120 tablet 1  . lisinopril (PRINIVIL,ZESTRIL) 20 MG tablet TAKE 1 TABLET BY MOUTH ONCE DAILY. 90 tablet PRN  . Multiple Vitamins-Minerals (CENTRUM SILVER ADULT 50+) TABS Take by mouth daily.     No current facility-administered medications on file prior to visit.    Allergies  Allergen Reactions  . Penicillin G     Other reaction(s): Unknown  . Augmentin [Amoxicillin-Pot Clavulanate] Rash  . Neosporin [Neomycin-Bacitracin Zn-Polymyx] Rash    Past Medical History  Diagnosis Date  . Hyperlipidemia   . Hypertension   . Carcinoid tumor 2005    Dr [facility]  . Depression   . Late effects of CVA (cerebrovascular accident)   . Osteoarthritis, multiple sites   . Anemia     iron deficiency in the past  . Type II or unspecified type diabetes mellitus without mention of complication, not stated as uncontrolled     Past Surgical History  Procedure Laterality Date  . Tonsillectomy    . Colon resection  2005    for carcinoid  . Hemorrhoid surgery      Family History  Problem Relation Age of Onset  . Stroke Mother   . Hypertension Mother   . Cancer Father   . Heart disease Father   . Diabetes Brother     Social History   Social History  . Marital Status: Widowed    Spouse Name: N/A  . Number of Children: 2  . Years of Education: N/A   Occupational History  . Retired-- [place] History Main Topics  . Smoking status: Never Smoker   . Smokeless tobacco: Never Used  . Alcohol Use: No  . Drug Use: No  . Sexual Activity: Not on file   Other Topics Concern  . Not on file   Social History Narrative   Has living will   Son is health care POA   Requests DNR--done 11/02/11   No tube feeds if cognitively unaware   Review of Systems  No N/V Sleeping well--- gets up twice to void No urinary incontinence No rash Hearing suddenly went "out" a few months ago Had bad respiratory infection a while back--- took left over levaquin which seemed to help Jaundice did resolve suddenly    Objective:   Physical Exam  Constitutional:  Some wasting  Neck: Normal range of motion. No thyromegaly present.  Cardiovascular: Normal rate and regular rhythm.  Exam reveals no gallop.   Gr 2/6 systolic murmur ?faint pedal pulses  Pulmonary/Chest: Effort normal and breath sounds normal. No respiratory distress. She has no wheezes. She has no rales.  Abdominal: Soft. There is no tenderness.  Musculoskeletal: She exhibits no edema.  Lymphadenopathy:    She has no cervical adenopathy.  Psychiatric:    Sedate but not clearly depressed          Assessment & Plan:

## 2015-05-24 NOTE — Assessment & Plan Note (Signed)
BP Readings from Last 3 Encounters:  05/24/15 142/60  04/19/15 176/77  02/22/15 140/60   Acceptable control

## 2015-05-24 NOTE — Assessment & Plan Note (Signed)
Seems "blue" but no MDD No Rx for this

## 2015-05-24 NOTE — Assessment & Plan Note (Signed)
Doesn't check Will do labs today

## 2015-05-24 NOTE — Assessment & Plan Note (Signed)
Presumed to liver with past jaundice and nodes Off the Rx---has had sweats since then (ibuprofen for this and aches) On tylenol regularly also Still sees Dr Oliva Bustard Lomotil for the diarrhea

## 2015-06-01 ENCOUNTER — Other Ambulatory Visit: Payer: Self-pay | Admitting: *Deleted

## 2015-06-01 DIAGNOSIS — C7A8 Other malignant neuroendocrine tumors: Secondary | ICD-10-CM

## 2015-06-01 MED ORDER — DIPHENOXYLATE-ATROPINE 2.5-0.025 MG PO TABS: 1.0000 | ORAL_TABLET | ORAL | Status: DC | PRN

## 2015-06-23 ENCOUNTER — Other Ambulatory Visit: Payer: Self-pay | Admitting: *Deleted

## 2015-06-23 NOTE — Telephone Encounter (Signed)
Too early

## 2015-07-15 ENCOUNTER — Other Ambulatory Visit: Payer: Self-pay | Admitting: *Deleted

## 2015-07-15 DIAGNOSIS — C7A8 Other malignant neuroendocrine tumors: Secondary | ICD-10-CM

## 2015-07-15 MED ORDER — DIPHENOXYLATE-ATROPINE 2.5-0.025 MG PO TABS: 1.0000 | ORAL_TABLET | ORAL | Status: DC | PRN

## 2015-08-16 ENCOUNTER — Other Ambulatory Visit: Payer: Self-pay | Admitting: *Deleted

## 2015-08-16 DIAGNOSIS — C7A8 Other malignant neuroendocrine tumors: Secondary | ICD-10-CM

## 2015-08-16 MED ORDER — DIPHENOXYLATE-ATROPINE 2.5-0.025 MG PO TABS: 1.0000 | ORAL_TABLET | ORAL | Status: AC | PRN

## 2015-08-24 ENCOUNTER — Other Ambulatory Visit: Payer: Self-pay | Admitting: Internal Medicine

## 2015-08-24 ENCOUNTER — Other Ambulatory Visit: Payer: Self-pay

## 2015-08-24 MED ORDER — HYDROCORTISONE 2.5 % EX CREA: TOPICAL_CREAM | Freq: Three times a day (TID) | CUTANEOUS | Status: AC | PRN

## 2015-08-24 NOTE — Telephone Encounter (Signed)
Approved: #[id number] x 2 

## 2015-08-24 NOTE — Telephone Encounter (Signed)
Rx sent electronically.  

## 2015-08-24 NOTE — Telephone Encounter (Signed)
Last rx was done 06/2013. Okay with you to fill?

## 2015-08-24 NOTE — Telephone Encounter (Signed)
Sophia Hill left v/m requesting refill hydrocortisone cream to [facility]. Appears pt has a bug bite that has reddened and itching. Sophia Hill request cb.

## 2015-08-24 NOTE — Telephone Encounter (Signed)
Yes

## 2015-08-31 ENCOUNTER — Encounter: Payer: Self-pay | Admitting: Primary Care

## 2015-08-31 ENCOUNTER — Telehealth: Payer: Self-pay | Admitting: Internal Medicine

## 2015-08-31 ENCOUNTER — Ambulatory Visit (INDEPENDENT_AMBULATORY_CARE_PROVIDER_SITE_OTHER): Payer: Medicare Other | Admitting: Primary Care

## 2015-08-31 VITALS — BP 180/70 | HR 58 | Temp 98.3°F | Wt 111.8 lb

## 2015-08-31 DIAGNOSIS — R600 Localized edema: Secondary | ICD-10-CM | POA: Diagnosis not present

## 2015-08-31 DIAGNOSIS — R319 Hematuria, unspecified: Secondary | ICD-10-CM

## 2015-08-31 DIAGNOSIS — N390 Urinary tract infection, site not specified: Secondary | ICD-10-CM

## 2015-08-31 LAB — POC URINALSYSI DIPSTICK (AUTOMATED)
Glucose, UA: NEGATIVE
KETONES UA: NEGATIVE
Nitrite, UA: POSITIVE
PH UA: 6
Urobilinogen, UA: NEGATIVE

## 2015-08-31 MED ORDER — CIPROFLOXACIN HCL 250 MG PO TABS: 250.0000 mg | ORAL_TABLET | Freq: Two times a day (BID) | ORAL | Status: DC

## 2015-08-31 NOTE — Telephone Encounter (Signed)
See if they can bring her in at 12:45---hate to have them go to urgent care

## 2015-08-31 NOTE — Progress Notes (Signed)
Subjective:    Patient ID: Sophia Hill, female    DOB: 1922/03/19, 82 y.o.   MRN: [id number]  HPI  Ms. Bookhart is a 82 year old female with a history of controlled Type 2 diabetes, hypertension, and osteoarthritis who presents today with multiple complaints.  1) Lower extremity edema: Present to the lower portion of her bilateral lower extremities, but without swelling present to her ankles. Swelling has been present for the past 5 days. She does sit for most of the day, occasionally keeps her legs elevated. She has a history of leg swelling in the past. Her family reports weakness and fatigue over the past several days. Denies chest pain, shortness of breath. No diagnosis of congestive heart failure in her record and she is not managed on a diuretic.  2) Back Pain: Located to lower back, also with urinary frequency. Denies hematuria, fevers, foul-smelling urine. She has noticed increased weakness and fatigue over the past several days.  Review of Systems  Constitutional: Positive for fatigue. Negative for fever.  Respiratory: Negative for shortness of breath.   Cardiovascular: Positive for leg swelling. Negative for chest pain.  Genitourinary: Negative for dysuria.  Musculoskeletal: Positive for back pain.  Neurological: Positive for weakness.       Past Medical History  Diagnosis Date  . Hyperlipidemia   . Hypertension   . Carcinoid tumor 2005    Dr Lezlie Lye [facility]  . Depression   . Late effects of CVA (cerebrovascular accident)   . Osteoarthritis, multiple sites   . Anemia     iron deficiency in the past  . Type II or unspecified type diabetes mellitus without mention of complication, not stated as uncontrolled      Social History   Social History  . Marital Status: Widowed    Spouse Name: N/A  . Number of Children: 2  . Years of Education: N/A   Occupational History  . Retired-- [employer] History Main Topics    . Smoking status: Never Smoker   . Smokeless tobacco: Never Used  . Alcohol Use: No  . Drug Use: No  . Sexual Activity: Not on file   Other Topics Concern  . Not on file   Social History Narrative   Has living will   Son is health care POA   Requests DNR--done 11/02/11   No tube feeds if cognitively unaware    Past Surgical History  Procedure Laterality Date  . Tonsillectomy    . Colon resection  2005    for carcinoid  . Hemorrhoid surgery      Family History  Problem Relation Age of Onset  . Stroke Mother   . Hypertension Mother   . Cancer Father   . Heart disease Father   . Diabetes Brother     Allergies  Allergen Reactions  . Penicillin G     Other reaction(s): Unknown  . Augmentin [Amoxicillin-Pot Clavulanate] Rash  . Neosporin [Neomycin-Bacitracin Zn-Polymyx] Rash    Current Outpatient Prescriptions on File Prior to Visit  Medication Sig Dispense Refill  . acetaminophen (TYLENOL 8 HOUR ARTHRITIS PAIN) 650 MG CR tablet Take 1,300 mg by mouth every 8 (eight) hours as needed for pain.    Marland Kitchen amLODipine (NORVASC) 2.5 MG tablet TAKE 1 TABLET BY MOUTH ONCE DAILY. 90 tablet 3  . aspirin 81 MG tablet Take 81 mg by mouth daily.    . cloNIDine (CATAPRES - DOSED IN MG/24 HR)  0.2 mg/24hr patch APPLY 1 PATCH TOPICALLY ONCE WEEKLY. REMOVE OLD PATCH AND REPLACE WITH NEW PATCH EVERY 7 DAYS. 12 patch 3  . diphenoxylate-atropine (LOMOTIL) 2.5-0.025 MG tablet Take 1 tablet by mouth every 4 (four) hours as needed for diarrhea or loose stools. 180 tablet 3  . hydrocortisone 2.5 % cream Apply topically 3 (three) times daily as needed. To irritated areas 60 g 2  . hydrocortisone 2.5 % cream APPLY TOPICALLY TO IRRITATED AREA(S) 3 TIMES DAILY AS NEEDED. 30 g 2  . lisinopril (PRINIVIL,ZESTRIL) 20 MG tablet TAKE 1 TABLET BY MOUTH ONCE DAILY. 90 tablet PRN  . Multiple Vitamins-Minerals (CENTRUM SILVER ADULT 50+) TABS Take by mouth daily.     No current facility-administered medications  on file prior to visit.    BP 180/70 mmHg  Pulse 58  Temp(Src) 98.3 F (36.8 C) (Oral)  Wt 111 lb 12.8 oz (50.712 kg)  SpO2 90%    Objective:   Physical Exam  Constitutional: She appears well-nourished.  Neck: Neck supple.  Cardiovascular: Normal rate and regular rhythm.   Murmur heard. Pulses:      Dorsalis pedis pulses are 2+ on the right side, and 2+ on the left side.       Posterior tibial pulses are 2+ on the right side, and 2+ on the left side.  Moderate to significant lower extremity edema located to bilateral lower extremities from calves through just proximal to ankles. No ankle edema. 1+ pitting bilaterally. Negative Homans sign, no erythema.  Pulmonary/Chest: Effort normal and breath sounds normal. She has no wheezes. She has no rales.  Abdominal: Soft. Bowel sounds are normal. There is no tenderness. There is no CVA tenderness.  Skin: Skin is warm and dry.          Assessment & Plan:  Urinary tract infection:  Low back pain and urinary frequency for the past several days along with fatigue and weakness. UA: Trace leuks, positive nitrites, 2+ blood Culture sent Given fatigue, frequency, low back pain, will treat with low-dose Cipro for now and await culture results. Discussed importance of proper hydration.  Lower extremity edema:  Located to bilateral lower extremities as mentioned above. Grade 2 murmur noted during exam today, no evidence of CHF in chart. No echocardiogram on file. Discussed my findings with the family who agree to decline echocardiogram as they do not wish to pursue any treatment. Given age of 32 diuretics are risky. Discussed to obtain compression hose, prescription provided. Also discussed importance of elevation and avoiding dependent positions for a prolonged amount of time. Family will keep [place] updated regarding her edema.  Sheral Flow, NP

## 2015-08-31 NOTE — Progress Notes (Signed)
Pre visit review using our clinic review tool, if applicable. No additional management support is needed unless otherwise documented below in the visit note. 

## 2015-08-31 NOTE — Telephone Encounter (Signed)
Spoke to [name], she "hates to mess up Dr. Alla German schedule, we'll just go to urgent care", explained that Dr. Silvio Pate does not feel that urgent care is appropriate for pt, scheduled with Allie Bossier 2:45 today.

## 2015-08-31 NOTE — Patient Instructions (Addendum)
Start Ciprofloxacin tablets for urinary tract infection. Take 1 tablet by mouth twice daily for 5 days.  Ensure you are staying hydrated with water.  Apply compression hose to your lower legs daily.   Elevated your legs at all times when resting.  Please notify me if no improvement in 1 week.   It was a pleasure meeting you!

## 2015-08-31 NOTE — Telephone Encounter (Signed)
That is much better. Thanks for your efforts

## 2015-08-31 NOTE — Telephone Encounter (Signed)
Daughter in law [name] [place] called, pt has entire lower leg swelling, she states this is new onset and has declined [facility] or appointment with any other provider.  Please advise.  They state they do not mind going to urgent care if PCP recommends.  Best number to call is [phone]

## 2015-09-03 LAB — URINE CULTURE: Colony Count: >=100000

## 2015-09-12 ENCOUNTER — Ambulatory Visit: Payer: Medicare Other | Admitting: Family Medicine

## 2015-09-12 ENCOUNTER — Telehealth: Payer: Self-pay | Admitting: Internal Medicine

## 2015-09-12 ENCOUNTER — Emergency Department: Payer: Medicare Other

## 2015-09-12 ENCOUNTER — Inpatient Hospital Stay
Admission: EM | Admit: 2015-09-12 | Discharge: 2015-09-15 | DRG: 291 | Disposition: A | Discharge status: Home / Self Care | Payer: Medicare Other | Attending: Internal Medicine | Admitting: Internal Medicine

## 2015-09-12 DIAGNOSIS — Z809 Family history of malignant neoplasm, unspecified: Secondary | ICD-10-CM

## 2015-09-12 DIAGNOSIS — Z880 Allergy status to penicillin: Secondary | ICD-10-CM | POA: Diagnosis not present

## 2015-09-12 DIAGNOSIS — N390 Urinary tract infection, site not specified: Secondary | ICD-10-CM | POA: Diagnosis present

## 2015-09-12 DIAGNOSIS — M7989 Other specified soft tissue disorders: Secondary | ICD-10-CM | POA: Diagnosis present

## 2015-09-12 DIAGNOSIS — Z8673 Personal history of transient ischemic attack (TIA), and cerebral infarction without residual deficits: Secondary | ICD-10-CM

## 2015-09-12 DIAGNOSIS — Z8249 Family history of ischemic heart disease and other diseases of the circulatory system: Secondary | ICD-10-CM

## 2015-09-12 DIAGNOSIS — I69351 Hemiplegia and hemiparesis following cerebral infarction affecting right dominant side: Secondary | ICD-10-CM | POA: Diagnosis not present

## 2015-09-12 DIAGNOSIS — R627 Adult failure to thrive: Secondary | ICD-10-CM | POA: Diagnosis present

## 2015-09-12 DIAGNOSIS — Z7982 Long term (current) use of aspirin: Secondary | ICD-10-CM | POA: Diagnosis not present

## 2015-09-12 DIAGNOSIS — Z66 Do not resuscitate: Secondary | ICD-10-CM | POA: Diagnosis present

## 2015-09-12 DIAGNOSIS — F0390 Unspecified dementia without behavioral disturbance: Secondary | ICD-10-CM | POA: Diagnosis present

## 2015-09-12 DIAGNOSIS — Z888 Allergy status to other drugs, medicaments and biological substances status: Secondary | ICD-10-CM

## 2015-09-12 DIAGNOSIS — C7B00 Secondary carcinoid tumors, unspecified site: Secondary | ICD-10-CM | POA: Diagnosis present

## 2015-09-12 DIAGNOSIS — J9601 Acute respiratory failure with hypoxia: Secondary | ICD-10-CM | POA: Diagnosis present

## 2015-09-12 DIAGNOSIS — Z79899 Other long term (current) drug therapy: Secondary | ICD-10-CM

## 2015-09-12 DIAGNOSIS — Z9889 Other specified postprocedural states: Secondary | ICD-10-CM | POA: Diagnosis not present

## 2015-09-12 DIAGNOSIS — Z87440 Personal history of urinary (tract) infections: Secondary | ICD-10-CM

## 2015-09-12 DIAGNOSIS — R600 Localized edema: Secondary | ICD-10-CM | POA: Insufficient documentation

## 2015-09-12 DIAGNOSIS — Z823 Family history of stroke: Secondary | ICD-10-CM | POA: Diagnosis not present

## 2015-09-12 DIAGNOSIS — Z833 Family history of diabetes mellitus: Secondary | ICD-10-CM | POA: Diagnosis not present

## 2015-09-12 DIAGNOSIS — J810 Acute pulmonary edema: Secondary | ICD-10-CM | POA: Diagnosis present

## 2015-09-12 DIAGNOSIS — E119 Type 2 diabetes mellitus without complications: Secondary | ICD-10-CM | POA: Diagnosis present

## 2015-09-12 DIAGNOSIS — I509 Heart failure, unspecified: Secondary | ICD-10-CM

## 2015-09-12 DIAGNOSIS — M1990 Unspecified osteoarthritis, unspecified site: Secondary | ICD-10-CM | POA: Diagnosis present

## 2015-09-12 DIAGNOSIS — I110 Hypertensive heart disease with heart failure: Principal | ICD-10-CM | POA: Diagnosis present

## 2015-09-12 DIAGNOSIS — J449 Chronic obstructive pulmonary disease, unspecified: Secondary | ICD-10-CM | POA: Diagnosis present

## 2015-09-12 DIAGNOSIS — K529 Noninfective gastroenteritis and colitis, unspecified: Secondary | ICD-10-CM | POA: Diagnosis present

## 2015-09-12 DIAGNOSIS — Z515 Encounter for palliative care: Secondary | ICD-10-CM | POA: Insufficient documentation

## 2015-09-12 HISTORY — DX: Cerebral infarction, unspecified: I63.9

## 2015-09-12 HISTORY — DX: Malignant (primary) neoplasm, unspecified: C80.1

## 2015-09-12 HISTORY — DX: Chronic obstructive pulmonary disease, unspecified: J44.9

## 2015-09-12 LAB — BRAIN NATRIURETIC PEPTIDE: B NATRIURETIC PEPTIDE 5: 2199 pg/mL — AB (ref 0.0–100.0)

## 2015-09-12 LAB — CBC
HCT: 25.3 % — ABNORMAL LOW (ref 35.0–47.0)
HEMOGLOBIN: 8.3 g/dL — AB (ref 12.0–16.0)
MCH: 22.5 pg — AB (ref 26.0–34.0)
MCHC: 32.7 g/dL (ref 32.0–36.0)
MCV: 68.7 fL — AB (ref 80.0–100.0)
PLATELETS: 490 10*3/uL — AB (ref 150–440)
RBC: 3.69 MIL/uL — ABNORMAL LOW (ref 3.80–5.20)
RDW: 17.9 % — ABNORMAL HIGH (ref 11.5–14.5)
WBC: 20 10*3/uL — ABNORMAL HIGH (ref 3.6–11.0)

## 2015-09-12 LAB — COMPREHENSIVE METABOLIC PANEL
ALK PHOS: 406 U/L — AB (ref 38–126)
ALT: 25 U/L (ref 14–54)
ANION GAP: 10 (ref 5–15)
AST: 28 U/L (ref 15–41)
Albumin: 2.8 g/dL — ABNORMAL LOW (ref 3.5–5.0)
BUN: 17 mg/dL (ref 6–20)
CALCIUM: 8.9 mg/dL (ref 8.9–10.3)
CO2: 26 mmol/L (ref 22–32)
CREATININE: 0.73 mg/dL (ref 0.44–1.00)
Chloride: 101 mmol/L (ref 101–111)
Glucose, Bld: 156 mg/dL — ABNORMAL HIGH (ref 65–99)
Potassium: 3.7 mmol/L (ref 3.5–5.1)
SODIUM: 137 mmol/L (ref 135–145)
Total Bilirubin: 0.3 mg/dL (ref 0.3–1.2)
Total Protein: 7.4 g/dL (ref 6.5–8.1)

## 2015-09-12 LAB — URINALYSIS COMPLETE WITH MICROSCOPIC (ARMC ONLY)
BILIRUBIN URINE: NEGATIVE
Bacteria, UA: NONE SEEN
GLUCOSE, UA: NEGATIVE mg/dL
KETONES UR: NEGATIVE mg/dL
Leukocytes, UA: NEGATIVE
Nitrite: NEGATIVE
Protein, ur: 100 mg/dL — AB
RBC / HPF: NONE SEEN RBC/hpf (ref 0–5)
SPECIFIC GRAVITY, URINE: 1.011 (ref 1.005–1.030)
SQUAMOUS EPITHELIAL / LPF: NONE SEEN
WBC, UA: NONE SEEN WBC/hpf (ref 0–5)
pH: 6 (ref 5.0–8.0)

## 2015-09-12 LAB — TROPONIN I: TROPONIN I: 0.08 ng/mL — AB (ref <0.031)

## 2015-09-12 MED ORDER — FUROSEMIDE 10 MG/ML IJ SOLN
40.0000 mg | Freq: Once | INTRAMUSCULAR | Status: AC
  Administered 2015-09-12: 40 mg via INTRAVENOUS
  Filled 2015-09-12: qty 4

## 2015-09-12 MED ORDER — NITROGLYCERIN 2 % TD OINT
1.0000 [in_us] | TOPICAL_OINTMENT | Freq: Once | TRANSDERMAL | Status: AC
  Administered 2015-09-12: 1 [in_us] via TOPICAL
  Filled 2015-09-12: qty 1

## 2015-09-12 MED ORDER — ASPIRIN 81 MG PO CHEW
81.0000 mg | CHEWABLE_TABLET | Freq: Every day | ORAL | Status: DC
  Administered 2015-09-13 – 2015-09-15 (×3): 81 mg via ORAL
  Filled 2015-09-12 (×3): qty 1

## 2015-09-12 MED ORDER — ENALAPRILAT 1.25 MG/ML IV SOLN
0.6250 mg | Freq: Once | INTRAVENOUS | Status: AC
  Administered 2015-09-12: 0.625 mg via INTRAVENOUS
  Filled 2015-09-12: qty 2

## 2015-09-12 MED ORDER — SODIUM CHLORIDE 0.9 % IV SOLN: 250.0000 mL | INTRAVENOUS | Status: DC | PRN

## 2015-09-12 MED ORDER — ONDANSETRON HCL 4 MG/2ML IJ SOLN: 4.0000 mg | Freq: Four times a day (QID) | INTRAMUSCULAR | Status: DC | PRN

## 2015-09-12 MED ORDER — SODIUM CHLORIDE 0.9% FLUSH
3.0000 mL | Freq: Two times a day (BID) | INTRAVENOUS | Status: DC
Start: 2015-09-12 — End: 2015-09-12

## 2015-09-12 MED ORDER — ACETAMINOPHEN 325 MG PO TABS: 650.0000 mg | ORAL_TABLET | Freq: Four times a day (QID) | ORAL | Status: DC | PRN

## 2015-09-12 MED ORDER — FUROSEMIDE 10 MG/ML IJ SOLN
40.0000 mg | Freq: Two times a day (BID) | INTRAMUSCULAR | Status: DC
  Administered 2015-09-13 – 2015-09-15 (×5): 40 mg via INTRAVENOUS
  Filled 2015-09-12 (×7): qty 4

## 2015-09-12 MED ORDER — ENOXAPARIN SODIUM 40 MG/0.4ML ~~LOC~~ SOLN
40.0000 mg | SUBCUTANEOUS | Status: DC
  Administered 2015-09-12 – 2015-09-14 (×3): 40 mg via SUBCUTANEOUS
  Filled 2015-09-12 (×3): qty 0.4

## 2015-09-12 MED ORDER — LISINOPRIL-HYDROCHLOROTHIAZIDE 20-25 MG PO TABS: 1.0000 | ORAL_TABLET | Freq: Every day | ORAL | Status: DC

## 2015-09-12 MED ORDER — AMLODIPINE BESYLATE 5 MG PO TABS
2.5000 mg | ORAL_TABLET | Freq: Every day | ORAL | Status: DC
  Administered 2015-09-12 – 2015-09-13 (×2): 2.5 mg via ORAL
  Filled 2015-09-12 (×2): qty 1

## 2015-09-12 MED ORDER — CLONIDINE HCL 0.1 MG/24HR TD PTWK
0.2000 mg | MEDICATED_PATCH | TRANSDERMAL | Status: DC
  Administered 2015-09-15: 0.2 mg via TRANSDERMAL
  Filled 2015-09-12: qty 2

## 2015-09-12 MED ORDER — ALBUTEROL SULFATE (2.5 MG/3ML) 0.083% IN NEBU: 2.5000 mg | INHALATION_SOLUTION | RESPIRATORY_TRACT | Status: DC | PRN

## 2015-09-12 MED ORDER — ONDANSETRON HCL 4 MG PO TABS: 4.0000 mg | ORAL_TABLET | Freq: Four times a day (QID) | ORAL | Status: DC | PRN

## 2015-09-12 MED ORDER — SODIUM CHLORIDE 0.9% FLUSH
3.0000 mL | Freq: Two times a day (BID) | INTRAVENOUS | Status: DC
  Administered 2015-09-12 – 2015-09-15 (×6): 3 mL via INTRAVENOUS

## 2015-09-12 MED ORDER — ACETAMINOPHEN 650 MG RE SUPP: 650.0000 mg | Freq: Four times a day (QID) | RECTAL | Status: DC | PRN

## 2015-09-12 MED ORDER — HYDRALAZINE HCL 20 MG/ML IJ SOLN
INTRAMUSCULAR | Status: AC
  Filled 2015-09-12: qty 1

## 2015-09-12 MED ORDER — DIPHENOXYLATE-ATROPINE 2.5-0.025 MG PO TABS
1.0000 | ORAL_TABLET | ORAL | Status: DC | PRN
  Administered 2015-09-12: 1 via ORAL
  Filled 2015-09-12: qty 1
  Filled 2015-09-12 (×2): qty 2

## 2015-09-12 MED ORDER — HYDROCHLOROTHIAZIDE 25 MG PO TABS
25.0000 mg | ORAL_TABLET | Freq: Every day | ORAL | Status: DC
  Administered 2015-09-13 – 2015-09-15 (×3): 25 mg via ORAL
  Filled 2015-09-12 (×3): qty 1

## 2015-09-12 MED ORDER — HYDRALAZINE HCL 20 MG/ML IJ SOLN
5.0000 mg | Freq: Four times a day (QID) | INTRAMUSCULAR | Status: DC | PRN
  Administered 2015-09-12: 5 mg via INTRAVENOUS
  Filled 2015-09-12 (×2): qty 1

## 2015-09-12 MED ORDER — LISINOPRIL 20 MG PO TABS
20.0000 mg | ORAL_TABLET | Freq: Every day | ORAL | Status: DC
  Administered 2015-09-13 – 2015-09-15 (×3): 20 mg via ORAL
  Filled 2015-09-12 (×3): qty 1

## 2015-09-12 MED ORDER — POTASSIUM CHLORIDE CRYS ER 20 MEQ PO TBCR
20.0000 meq | EXTENDED_RELEASE_TABLET | Freq: Two times a day (BID) | ORAL | Status: DC
  Administered 2015-09-12 – 2015-09-15 (×6): 20 meq via ORAL
  Filled 2015-09-12 (×6): qty 1

## 2015-09-12 MED ORDER — SODIUM CHLORIDE 0.9% FLUSH: 3.0000 mL | INTRAVENOUS | Status: DC | PRN

## 2015-09-12 NOTE — Progress Notes (Signed)
PT Cancellation Note  Patient Details Name: PAMELLA SPRINKLE MRN: [id number] DOB: 26-Apr-1921   Cancelled Treatment:    Reason Eval/Treat Not Completed: Medical issues which prohibited therapy. Patient admitted today, with elevated troponin levels, on 4L of O2 and BP over [id number] systolic. Given her age and symptoms, PT will defer mobility evaluation until she is more hemodynamically stable.   Kerman Passey, PT, DPT    09/12/2015, 5:08 PM

## 2015-09-12 NOTE — ED Notes (Signed)
Pt arrives to ER via [facility] from [facility]. Pt c/o SOB that began yesterday, distress that began today. Pt oxygen in 80's upon first responder arrival. Pt placed on 4L [place] and 98% at this time. Pt coarse crackles bilaterally. Color WNL at this time. RR 28-32. MD at bedside. Pt hypertensive with [facility]. 18G placed to L arm by [facility]. Pt alert and oriented X4.

## 2015-09-12 NOTE — Telephone Encounter (Signed)
I'm sorry to hear that.  Please call pt tomorrow to check on her.

## 2015-09-12 NOTE — ED Provider Notes (Addendum)
[facility] Emergency Department Provider Note  ____________________________________________    I have reviewed the triage vital signs and the nursing notes.   HISTORY  Chief Complaint Shortness of Breath and Respiratory Distress    HPI Sophia Hill is a 82 y.o. female who presents with respiratory distress that began this morning. Patient reports she is never had this before. She notes she felt worsening shortness of breath which started this morning. She denies a history of heart failure. No chest pain. No cough or fevers. Recent travel or leg pain. She has had lower show any swelling.     Past Medical History  Diagnosis Date  . Hyperlipidemia   . Hypertension   . Carcinoid tumor 2005    Dr Lezlie Lye [facility]  . Depression   . Late effects of CVA (cerebrovascular accident)   . Osteoarthritis, multiple sites   . Anemia     iron deficiency in the past  . Type II or unspecified type diabetes mellitus without mention of complication, not stated as uncontrolled   . COPD (chronic obstructive pulmonary disease) ([place])   . Stroke ([place])   . Cancer [facility])     Patient Active Problem List   Diagnosis Date Noted  . Leg edema 09/12/2015  . Metastatic carcinoid tumor ([place]) 05/24/2015  . Aortic sclerosis ([place]) 02/22/2015  . Fever 01/19/2015  . Mild malnutrition ([place]) 01/19/2015  . Neuroendocrine carcinoma of colon ([place]) 07/24/2014  . Malignant carcinoid tumor of unknown primary site ([place]) 07/22/2014  . Preventative health care 07/19/2014  . Advance directive discussed with patient 07/19/2014  . Diabetes mellitus type 2, controlled, without complications ([place])   . Hyperlipidemia   . Hypertension due to endocrine disorder   . Carcinoid tumor   . Episodic mood disorder ([place])   . Late effects of CVA (cerebrovascular accident)   . Osteoarthritis, multiple sites   . Anemia     Past Surgical History  Procedure Laterality Date   . Tonsillectomy    . Colon resection  2005    for carcinoid  . Hemorrhoid surgery      Current Outpatient Rx  Name  Route  Sig  Dispense  Refill  . acetaminophen (TYLENOL 8 HOUR ARTHRITIS PAIN) 650 MG CR tablet   Oral   Take 1,300 mg by mouth every 8 (eight) hours as needed for pain.         Marland Kitchen amLODipine (NORVASC) 2.5 MG tablet   Oral   Take 2.5 mg by mouth daily.         Marland Kitchen aspirin 81 MG tablet   Oral   Take 81 mg by mouth daily.         . cloNIDine (CATAPRES - DOSED IN MG/24 HR) 0.2 mg/24hr patch   Transdermal   Place 0.2 mg onto the skin once a week.         . diphenoxylate-atropine (LOMOTIL) 2.5-0.025 MG tablet   Oral   Take 1 tablet by mouth every 4 (four) hours as needed for diarrhea or loose stools.   180 tablet   3   . hydrocortisone 2.5 % cream   Topical   Apply topically 3 (three) times daily as needed. To irritated areas   60 g   2   . lisinopril-hydrochlorothiazide (PRINZIDE,ZESTORETIC) 20-25 MG tablet   Oral   Take 1 tablet by mouth daily.         . Multiple Vitamins-Minerals (CENTRUM SILVER ADULT 50+) TABS  Oral   Take by mouth daily.           Allergies Penicillin g; Augmentin; and Neosporin  Family History  Problem Relation Age of Onset  . Stroke Mother   . Hypertension Mother   . Cancer Father   . Heart disease Father   . Diabetes Brother     Social History Social History  Substance Use Topics  . Smoking status: Never Smoker   . Smokeless tobacco: Never Used  . Alcohol Use: No    Review of Systems  Constitutional: Negative for fever. Eyes: Negative for redness ENT: Negative for Throat swelling Cardiovascular: Negative for chest pain Respiratory: As above Gastrointestinal: Negative for abdominal pain Genitourinary: Negative for dysuria. Musculoskeletal: Negative for back pain. Skin: Negative for rash. Neurological: Negative for focal weakness Psychiatric: Positive for  anxiety    ____________________________________________   PHYSICAL EXAM:  VITAL SIGNS: ED Triage Vitals  Enc Vitals Group     BP 09/12/15 1138 234/77 mmHg     Pulse Rate 09/12/15 1138 97     Resp 09/12/15 1138 30     Temp 09/12/15 1138 98.2 F (36.8 C)     Temp Source 09/12/15 1138 Oral     SpO2 09/12/15 1138 98 %     Weight 09/12/15 1138 116 lb 13.5 oz (53 kg)     Height --      Head Cir --      Peak Flow --      Pain Score --      Pain Loc --      Pain Edu? --      Excl. in [place]? --    Constitutional: Alert and oriented. Uncomfortable in mild distress Eyes: Conjunctivae are normal. No erythema or injection ENT   Head: Normocephalic and atraumatic.   Mouth/Throat: Mucous membranes are moist. Cardiovascular: Normal rate, regular rhythm. Normal and symmetric distal pulses are present in the upper extremities. Respiratory: Increased respiratory effort without tachypnea. Rales heard throughout Gastrointestinal: Soft and non-tender in all quadrants. No distention. There is no CVA tenderness. Genitourinary: deferred Musculoskeletal: Nontender with normal range of motion in all extremities. No lower extremity tenderness nor edema. Neurologic:  Normal speech and language. No gross focal neurologic deficits are appreciated. Skin:  Skin is warm, dry and intact. No rash noted. Psychiatric: Mood and affect are normal. Patient exhibits appropriate insight and judgment.  ____________________________________________    LABS (pertinent positives/negatives)  Labs Reviewed  CBC - Abnormal; Notable for the following:    WBC 20.0 (*)    RBC 3.69 (*)    Hemoglobin 8.3 (*)    HCT 25.3 (*)    MCV 68.7 (*)    MCH 22.5 (*)    RDW 17.9 (*)    Platelets 490 (*)    All other components within normal limits  COMPREHENSIVE METABOLIC PANEL - Abnormal; Notable for the following:    Glucose, Bld 156 (*)    Albumin 2.8 (*)    Alkaline Phosphatase 406 (*)    All other components  within normal limits  TROPONIN I - Abnormal; Notable for the following:    Troponin I 0.08 (*)    All other components within normal limits  BRAIN NATRIURETIC PEPTIDE - Abnormal; Notable for the following:    B Natriuretic Peptide 2199.0 (*)    All other components within normal limits  URINALYSIS COMPLETEWITH MICROSCOPIC ([facility] ONLY)    ____________________________________________   EKG  ED ECG REPORT I, Lavonia Drafts, the attending  physician, personally viewed and interpreted this ECG.  Date: 09/12/2015  Rate: 96 Rhythm: normal sinus rhythm QRS Axis: normal Intervals: normal ST/T Wave abnormalities: normal Conduction Disturbances: Narrative Interpretation: no stemi   ____________________________________________    RADIOLOGY  Chest x-ray consistent with pulmonary edema  ____________________________________________   PROCEDURES  Procedure(s) performed: none  Critical Care performed: yes  CRITICAL CARE Performed by: Lavonia Drafts   Total critical care time:30 minutes  Critical care time was exclusive of separately billable procedures and treating other patients.  Critical care was necessary to treat or prevent imminent or life-threatening deterioration.  Critical care was time spent personally by me on the following activities: development of treatment plan with patient and/or surrogate as well as nursing, discussions with consultants, evaluation of patient's response to treatment, examination of patient, obtaining history from patient or surrogate, ordering and performing treatments and interventions, ordering and review of laboratory studies, ordering and review of radiographic studies, pulse oximetry and re-evaluation of patient's condition.   ____________________________________________   INITIAL IMPRESSION / ASSESSMENT AND PLAN / ED COURSE  Pertinent labs & imaging results that were available during my care of the patient were reviewed by me and  considered in my medical decision making (see chart for details).  Patient presents in respiratory distress. Exam is most consistent with acute pulmonary edema. We will give enalaprilat, transdermal nitroglycerin, and IV lasix and carefully monitor  Patient significantly improved after tx. Resting comfortably in her room. We will admit to the hospitalist service  ____________________________________________   FINAL CLINICAL IMPRESSION(S) / ED DIAGNOSES  Final diagnoses:  Acute pulmonary edema ([place])          Lavonia Drafts, MD 09/12/15 1322  Lavonia Drafts, MD 09/12/15 1336

## 2015-09-12 NOTE — Telephone Encounter (Signed)
Linna Hoff pt son called stating he needed to cancel pt appointment with dr Deborra Medina Pt took and turn for the worse and is at [facility] hospital now

## 2015-09-12 NOTE — H&P (Signed)
[facility] [place] NAME: Sophia Hill    MR#:  [id number]  DATE OF BIRTH:  07/18/1921  DATE OF ADMISSION:  09/12/2015  PRIMARY CARE PHYSICIAN: Viviana Simpler, MD   REQUESTING/REFERRING PHYSICIAN: Dr. Corky Downs  CHIEF COMPLAINT:   Chief Complaint  Patient presents with  . Shortness of Breath  . Respiratory Distress    HISTORY OF PRESENT ILLNESS:  Sophia Hill  is a 82 y.o. female with a known history of CVA, hypertension, carcinoid tumor presents to the emergency room with complaints of 2 weeks of slowly worsening lower extremity swelling. Patient also developed shortness of breath. She is needing 3 L oxygen at this time in the emergency room to keep her saturations greater than 90%. Chest x-ray shows pulmonary edema with elevated BNP. Most of the history obtained from family and reviewing old records. Patient is able to give history but very minimal. She was seen recently by her PCP and an echocardiogram was refused by family as patient is 82 years old and they are interested in hospice at discharge. Family would like symptomatic treatment. At baseline patient ambulates with a walker. Has had poor appetite and some chronic diarrhea due to carcinoid tumor. Not on treatment for this. She has mild weakness on the right side chronically due to past stroke.  She is DO NOT RESUSCITATE.  Was on hospice in the past but her appetite improved and patient did well and was discharged from hospice.  PAST MEDICAL HISTORY:   Past Medical History  Diagnosis Date  . Hyperlipidemia   . Hypertension   . Carcinoid tumor 2005    [facility]  . Depression   . Late effects of CVA (cerebrovascular accident)   . Osteoarthritis, multiple sites   . Anemia     iron deficiency in the past  . Type II or unspecified type diabetes mellitus without mention of complication, not stated as uncontrolled   . COPD (chronic  obstructive pulmonary disease) ([place])   . Stroke ([place])   . [facility])     PAST SURGICAL HISTORY:   Past Surgical History  Procedure Laterality Date  . Tonsillectomy    . Colon resection  2005    for carcinoid  . Hemorrhoid surgery      SOCIAL HISTORY:   Social History  Substance Use Topics  . Smoking status: Never Smoker   . Smokeless tobacco: Never Used  . Alcohol Use: No    FAMILY HISTORY:   Family History  Problem Relation Age of Onset  . Stroke Mother   . Hypertension Mother   . Cancer Father   . Heart disease Father   . Diabetes Brother     DRUG ALLERGIES:   Allergies  Allergen Reactions  . Penicillin G     Other reaction(s): Unknown  . Augmentin [Amoxicillin-Pot Clavulanate] Rash  . Neosporin [Neomycin-Bacitracin Zn-Polymyx] Rash    REVIEW OF SYSTEMS:   Review of Systems  Constitutional: Positive for weight loss and malaise/fatigue. Negative for fever and chills.  HENT: Negative for sore throat.   Eyes: Negative for blurred vision, double vision and pain.  Respiratory: Positive for cough, sputum production and shortness of breath. Negative for hemoptysis and wheezing.   Cardiovascular: Positive for orthopnea and leg swelling. Negative for chest pain and palpitations.  Gastrointestinal: Positive for nausea, abdominal pain and diarrhea. Negative for heartburn, vomiting and constipation.  Genitourinary: Negative for dysuria and hematuria.  Musculoskeletal: Negative  for back pain and joint pain.  Skin: Negative for rash.  Neurological: Positive for focal weakness and weakness. Negative for sensory change, speech change and headaches.  Endo/Heme/Allergies: Does not bruise/bleed easily.  Psychiatric/Behavioral: Positive for memory loss. Negative for depression. The patient is not nervous/anxious.     MEDICATIONS AT HOME:   Prior to Admission medications   Medication Sig Start Date End Date Taking? Authorizing Provider  acetaminophen (TYLENOL 8 HOUR  ARTHRITIS PAIN) 650 MG CR tablet Take 1,300 mg by mouth every 8 (eight) hours as needed for pain.   Yes Historical Provider, MD  amLODipine (NORVASC) 2.5 MG tablet Take 2.5 mg by mouth daily.   Yes Historical Provider, MD  aspirin 81 MG tablet Take 81 mg by mouth daily.   Yes Historical Provider, MD  cloNIDine (CATAPRES - DOSED IN MG/24 HR) 0.2 mg/24hr patch Place 0.2 mg onto the skin once a week.   Yes Historical Provider, MD  diphenoxylate-atropine (LOMOTIL) 2.5-0.025 MG tablet Take 1 tablet by mouth every 4 (four) hours as needed for diarrhea or loose stools. 08/16/15  Yes Evlyn Kanner, NP  hydrocortisone 2.5 % cream Apply topically 3 (three) times daily as needed. To irritated areas 08/24/15  Yes Venia Carbon, MD  lisinopril-hydrochlorothiazide (PRINZIDE,ZESTORETIC) 20-25 MG tablet Take 1 tablet by mouth daily.   Yes Historical Provider, MD  Multiple Vitamins-Minerals (CENTRUM SILVER ADULT 50+) TABS Take by mouth daily.   Yes Historical Provider, MD     VITAL SIGNS:  Blood pressure 183/60, pulse 88, temperature 98.2 F (36.8 C), temperature source Oral, resp. rate 21, weight 53 kg (116 lb 13.5 oz), SpO2 100 %.  PHYSICAL EXAMINATION:  Physical Exam  GENERAL:  82 y.o.-year-old patient lying in the bed with Respiratory distress. Critically ill EYES: Pupils equal, round, reactive to light and accommodation. No scleral icterus. Extraocular muscles intact.  HEENT: Head atraumatic, normocephalic. Oropharynx and nasopharynx clear. No oropharyngeal erythema, moist oral mucosa  NECK:  Supple, no jugular venous distention. No thyroid enlargement, no tenderness.  LUNGS: Increased work of breathing with bilateral crackles and mild expiratory wheezing. CARDIOVASCULAR: S1, S2 normal. , rubs, or gallops. Systolic murmur ABDOMEN: Soft, nontender, nondistended. Bowel sounds present. No organomegaly or mass.  EXTREMITIES: No  cyanosis, or clubbing. + 2 pedal & radial pulses b/l.  Bilateral lower  extremity edema more in the thighs and hips than in the feet. NEUROLOGIC: Cranial nerves II through XII are intact. Sensations intact bilaterally. Right lower extremity strength 4/5 and right upper extremity 5-/5. Left upper and lower extremity is 5/5. PSYCHIATRIC: The patient is alert and awake.  SKIN: No obvious rash, lesion, or ulcer.   LABORATORY PANEL:   CBC  Recent Labs Lab 09/12/15 1143  WBC 20.0*  HGB 8.3*  HCT 25.3*  PLT 490*   ------------------------------------------------------------------------------------------------------------------  Chemistries   Recent Labs Lab 09/12/15 1143  NA 137  K 3.7  CL 101  CO2 26  GLUCOSE 156*  BUN 17  CREATININE 0.73  CALCIUM 8.9  AST 28  ALT 25  ALKPHOS 406*  BILITOT 0.3   ------------------------------------------------------------------------------------------------------------------  Cardiac Enzymes  Recent Labs Lab 09/12/15 1143  TROPONINI 0.08*   ------------------------------------------------------------------------------------------------------------------  RADIOLOGY:  Dg Chest Portable 1 View  09/12/2015  CLINICAL DATA:  Progressive shortness of breath since yesterday. Hypertension. COPD. EXAM: PORTABLE CHEST 1 VIEW COMPARISON:  05/09/2013 FINDINGS: The patient has bilateral pulmonary edema with bilateral pleural effusions, large on the right and small on the left. Chronic large hiatal hernia.  Aortic atherosclerosis. Hyperlucent lungs consistent with emphysema. No acute bone abnormality. IMPRESSION: Congestive heart failure with interstitial and alveolar pulmonary edema with bilateral pleural effusions. Emphysema. Aortic atherosclerosis. Electronically Signed   By: Lorriane Shire M.D.   On: 09/12/2015 11:57     IMPRESSION AND PLAN:   * Acute congestive heart failure with acute hypoxic respiratory failure Possible etiologies are long-standing uncontrolled hypertension, valvular disease, ACS. Discussed  with family regarding goals of care. Would like patient be treated symptomatically so that she improved with CHF. No echocardiogram or aggressive procedures. Will treat with IV Lasix 40 twice a day. Potassium supplements. Monitor input and output. Daily weights. Repeat electrolytes and BUN and creatinine tomorrow morning.  * Uncontrolled hypertension Continue patient's clonidine, lisinopril, hydrochlorothiazide, Norvasc. Monitor with diuresis. He wakes up and can increase the dose if blood pressure remains uncontrolled.  * Carcinoid tumor Patient has chronic on and off abdominal pain and chronic diarrhea. Not on treatment.  * Early dementia Watch for inpatient delirium  * Leukocytosis No source of infection found. Recently treated for UTI with Escherichia coli. Will repeat urinalysis. Check blood cultures. No antibiotics at this time. Repeat labs in the morning.  * Mild elevation of troponin likely due to CHF. Will not repeat as family does not want any aggressive procedures like stress test or cardiac catheterization.  * DVT prophylaxis with Lovenox.  All the records are reviewed and case discussed with ED provider. Management plans discussed with the patient, family and they are in agreement.  CODE STATUS: DNR  TOTAL TIME TAKING CARE OF THIS PATIENT: 40 minutes.   Hillary Bow R M.D on 09/12/2015 at 1:43 PM  Between 7am to 6pm - Pager - [phone]  After 6pm go to [email or web address] - password [email or web address] [facility]  Office  [phone]  CC: Primary care physician; Viviana Simpler, MD  Note: This dictation was prepared with Dragon dictation along with smaller phrase technology. Any transcriptional errors that result from this process are unintentional.

## 2015-09-12 NOTE — Progress Notes (Signed)
Sophia Hill is a 82 y.o. female patient admitted from ED awake, alert - oriented  X 4 - no acute distress noted.  VSS - Blood pressure 201/81, pulse 87, temperature 98.9 F (37.2 C), temperature source Oral, resp. rate 18, weight 53 kg (116 lb 13.5 oz), SpO2 99 %.    IV in place, occlusive dsg intact without redness.  Orientation to room, and floor completed with information packet given to patient/family.  Patient declined safety video at this time.  Admission INP armband ID verified with patient/family, and in place.   SR up x 2, fall assessment complete, with patient and family able to verbalize understanding of risk associated with falls, and verbalized understanding to call nsg before up out of bed.  Call light within reach, patient able to voice, and demonstrate understanding.  Skin, clean-dry- intact without evidence of bruising, or skin tears.   No evidence of skin break down noted on exam. Skin assessed with Juliann Pulse, RN. Tele box verified with Juliann Pulse, RN and Prince Solian, RN.      Will cont to eval and treat per MD orders.  Horton Finer, RN 09/12/2015 4:21 PM

## 2015-09-12 NOTE — ED Notes (Signed)
Attempt to call report, accepting RN unable to take report due to given mediations.

## 2015-09-12 NOTE — Telephone Encounter (Signed)
I will check on her tomorrow

## 2015-09-12 NOTE — Telephone Encounter (Signed)
I will send this to Dr Silvio Pate so he will know and he can send it back to me to check on her if needed.

## 2015-09-13 LAB — CBC
HCT: 24 % — ABNORMAL LOW (ref 35.0–47.0)
HEMOGLOBIN: 7.7 g/dL — AB (ref 12.0–16.0)
MCH: 21.9 pg — ABNORMAL LOW (ref 26.0–34.0)
MCHC: 32 g/dL (ref 32.0–36.0)
MCV: 68.5 fL — ABNORMAL LOW (ref 80.0–100.0)
PLATELETS: 400 10*3/uL (ref 150–440)
RBC: 3.51 MIL/uL — AB (ref 3.80–5.20)
RDW: 17.6 % — ABNORMAL HIGH (ref 11.5–14.5)
WBC: 15.6 10*3/uL — ABNORMAL HIGH (ref 3.6–11.0)

## 2015-09-13 LAB — BASIC METABOLIC PANEL
ANION GAP: 7 (ref 5–15)
BUN: 20 mg/dL (ref 6–20)
CALCIUM: 8.7 mg/dL — AB (ref 8.9–10.3)
CO2: 30 mmol/L (ref 22–32)
CREATININE: 0.73 mg/dL (ref 0.44–1.00)
Chloride: 101 mmol/L (ref 101–111)
GLUCOSE: 131 mg/dL — AB (ref 65–99)
Potassium: 3.6 mmol/L (ref 3.5–5.1)
Sodium: 138 mmol/L (ref 135–145)

## 2015-09-13 MED ORDER — DIPHENOXYLATE-ATROPINE 2.5-0.025 MG PO TABS
2.0000 | ORAL_TABLET | Freq: Three times a day (TID) | ORAL | Status: DC
  Administered 2015-09-13 – 2015-09-15 (×8): 2 via ORAL
  Filled 2015-09-13 (×6): qty 2

## 2015-09-13 MED ORDER — AMLODIPINE BESYLATE 5 MG PO TABS
5.0000 mg | ORAL_TABLET | Freq: Every day | ORAL | Status: DC
  Administered 2015-09-14 – 2015-09-15 (×2): 5 mg via ORAL
  Filled 2015-09-13 (×2): qty 1

## 2015-09-13 NOTE — Evaluation (Signed)
Physical Therapy Evaluation Patient Details Name: Sophia Hill MRN: [id number] DOB: 1921-09-29 Today's Date: 09/13/2015   History of Present Illness  Pt is admitted for CHF. Pt with history of CVA, HTN, cancer, and carcinoid tumor. Pt also with complaints of LE swelling.   Clinical Impression  Pt is a pleasant 82 year old female who was admitted for CHF. Pt performs bed mobility with mod assist, transfers with mod assist, and ambulation with min assist and rw. Pt confused, however is able to follow commands. Pt demonstrates deficits with strength/mobility/endurance/cognition. Would benefit from skilled PT to address above deficits and promote optimal return to PLOF. Pt appears to be at baseline level as she does not ambulate much in home environment. Recommend increased support for OOB attempts as she needs hands on assistance secondary to strength/balance. May need more assistance, like ALF with support.      Follow Up Recommendations Supervision for mobility/OOB    Equipment Recommendations       Recommendations for Other Services       Precautions / Restrictions Precautions Precautions: Fall Restrictions Weight Bearing Restrictions: No      Mobility  Bed Mobility Overal bed mobility: Needs Assistance Bed Mobility: Supine to Sit     Supine to sit: Mod assist     General bed mobility comments: assist for bed mobility. Once seated pt with post leaning noted. Pt then able to sit with min assist progressing to cga.   Transfers Overall transfer level: Needs assistance Equipment used: Rolling walker (2 wheeled) Transfers: Sit to/from Stand Sit to Stand: Min assist         General transfer comment: assist for sequencing and correct hand placement. Safe technique performed  Ambulation/Gait Ambulation/Gait assistance: Min assist Ambulation Distance (Feet): 3 Feet Assistive device: Rolling walker (2 wheeled) Gait Pattern/deviations: Step-to pattern     General  Gait Details: ambulated using RW and step to gait pattern. Pt takes heavy cues for sequencing of gait. Slow speed noted  Stairs            Wheelchair Mobility    Modified Rankin (Stroke Patients Only)       Balance Overall balance assessment: Needs assistance Sitting-balance support: Feet supported Sitting balance-Leahy Scale: Fair     Standing balance support: Bilateral upper extremity supported Standing balance-Leahy Scale: Fair                               Pertinent Vitals/Pain Pain Assessment: No/denies pain    Home Living Family/patient expects to be discharged to::  (independent living)                 Additional Comments: from [facility], has sitters that provide care    Prior Function Level of Independence: Needs assistance         Comments: per daughter in law, assistance is there in morning and throughout day and then in evening. Pt is very sedentary and sits in lift chair all day.     Hand Dominance        Extremity/Trunk Assessment   Upper Extremity Assessment: Generalized weakness (B UE grossly 3+/5)           Lower Extremity Assessment: Generalized weakness (B LE grossly 3+/5)         Communication   Communication: No difficulties  Cognition Arousal/Alertness: Awake/alert Behavior During Therapy: WFL for tasks assessed/performed Overall Cognitive Status: History of cognitive impairments -  at baseline                      General Comments      Exercises        Assessment/Plan    PT Assessment Patient needs continued PT services  PT Diagnosis Difficulty walking;Generalized weakness   PT Problem List Decreased strength;Decreased activity tolerance;Decreased balance;Decreased mobility  PT Treatment Interventions Gait training;DME instruction;Therapeutic exercise   PT Goals (Current goals can be found in the Care Plan section) Acute Rehab PT Goals Patient Stated Goal: unable to report PT  Goal Formulation: Patient unable to participate in goal setting Time For Goal Achievement: 09/27/15 Potential to Achieve Goals: Fair    Frequency Min 2X/week   Barriers to discharge        Co-evaluation               End of Session Equipment Utilized During Treatment: Gait belt;Oxygen Activity Tolerance: Patient tolerated treatment well Patient left: in chair;with chair alarm set;with family/visitor present Nurse Communication: Mobility status         Time: [id number] PT Time Calculation (min) (ACUTE ONLY): 24 min   Charges:   PT Evaluation $PT Eval Moderate Complexity: 1 Procedure     PT G Codes:        Murel Shenberger 2015-09-28, 4:47 PM  Greggory Stallion, PT, DPT [phone]

## 2015-09-13 NOTE — Progress Notes (Signed)
CSW is aware that patient is from [facility]. PT was not able to evaluate patient today to assist with determining patient's discharge needs. CSW will continue to follow and assist.  Ernest Pine, MSW, [place], [place] Social Worker [phone]

## 2015-09-13 NOTE — Clinical Documentation Improvement (Signed)
Internal Medicine  Can the diagnosis of " uncontrolled hypertension" be further specified?  Thank you    Hypertensive Urgency  Hypertensive Emergency  Hypertensive crisis  Other  Clinically Undetermined   Document any associated diagnoses/conditions Vavular disease, Exac of Congested heart failure, Acute respiratory failure   Supporting Information:   BP (180/70--234/77---198/63--207/65)   Treatment: IV Vasotec, Hydralazine x1 each resumed po medications   Please exercise your independent, professional judgment when responding. A specific answer is not anticipated or expected.   Thank You, [place] [phone]

## 2015-09-13 NOTE — Progress Notes (Signed)
[facility] [place] NAME: Sophia Hill    MR#:  [id number]  DATE OF BIRTH:  10/28/1921  SUBJECTIVE:  CHIEF COMPLAINT:   Chief Complaint  Patient presents with  . Shortness of Breath  . Respiratory Distress     Worsening leg edema and now shortness of breath for last few weeks. Came with congestive heart failure. Family refused to get the echocardiogram to have further diagnosis and just wanted to treat her for comfort.   Had good diuresis overnight still having shortness of breath and requiring supplemental oxygen.   She had multiple diarrhea episodes as she was taken off from loperamide, which she takes 5-6 times every day for a long time.  REVIEW OF SYSTEMS:  CONSTITUTIONAL: No fever, fatigue or weakness.  EYES: No blurred or double vision.  EARS, NOSE, AND THROAT: No tinnitus or ear pain.  RESPIRATORY: No cough,Positive for shortness of breath, wheezing or hemoptysis.  CARDIOVASCULAR: No chest pain, orthopnea, edema.  GASTROINTESTINAL: No nausea, vomiting, positive for diarrhea, no abdominal pain.  GENITOURINARY: No dysuria, hematuria.  ENDOCRINE: No polyuria, nocturia,  HEMATOLOGY: No anemia, easy bruising or bleeding SKIN: No rash or lesion. MUSCULOSKELETAL: No joint pain or arthritis.   NEUROLOGIC: No tingling, numbness, weakness.  PSYCHIATRY: No anxiety or depression.   ROS  DRUG ALLERGIES:   Allergies  Allergen Reactions  . Penicillin G     Other reaction(s): Unknown  . Augmentin [Amoxicillin-Pot Clavulanate] Rash  . Neosporin [Neomycin-Bacitracin Zn-Polymyx] Rash    VITALS:  Blood pressure 187/49, pulse 86, temperature 97.6 F (36.4 C), temperature source Oral, resp. rate 19, height 5\' 6"  (1.676 m), weight 46.584 kg (102 lb 11.2 oz), SpO2 96 %.  PHYSICAL EXAMINATION:  GENERAL:  82 y.o.-year-old thin patient lying in the bed with no acute distress.  EYES: Pupils equal, round, reactive to light and accommodation. No  scleral icterus. Extraocular muscles intact.  HEENT: Head atraumatic, normocephalic. Oropharynx and nasopharynx clear.  NECK:  Supple, no jugular venous distention. No thyroid enlargement, no tenderness.  LUNGS: Normal breath sounds bilaterally, no wheezing, some crepitation. No use of accessory muscles of respiration.  CARDIOVASCULAR: S1, S2 normal. Present systolic murmurs, no rubs, or gallops.  ABDOMEN: Soft, nontender, nondistended. Bowel sounds present. No organomegaly or mass.  EXTREMITIES: No pedal edema, cyanosis, or clubbing.  NEUROLOGIC: Cranial nerves II through XII are intact. Muscle strength 5/5 in all extremities. Sensation intact. Gait not checked.  PSYCHIATRIC: The patient is alert and oriented x 3.  SKIN: No obvious rash, lesion, or ulcer.   Physical Exam LABORATORY PANEL:   CBC  Recent Labs Lab 09/13/15 1012  WBC 15.6*  HGB 7.7*  HCT 24.0*  PLT 400   ------------------------------------------------------------------------------------------------------------------  Chemistries   Recent Labs Lab 09/12/15 1143 09/13/15 1012  NA 137 138  K 3.7 3.6  CL 101 101  CO2 26 30  GLUCOSE 156* 131*  BUN 17 20  CREATININE 0.73 0.73  CALCIUM 8.9 8.7*  AST 28  --   ALT 25  --   ALKPHOS 406*  --   BILITOT 0.3  --    ------------------------------------------------------------------------------------------------------------------  Cardiac Enzymes  Recent Labs Lab 09/12/15 1143  TROPONINI 0.08*   ------------------------------------------------------------------------------------------------------------------  RADIOLOGY:  Dg Chest Portable 1 View  09/12/2015  CLINICAL DATA:  Progressive shortness of breath since yesterday. Hypertension. COPD. EXAM: PORTABLE CHEST 1 VIEW COMPARISON:  05/09/2013 FINDINGS: The patient has bilateral pulmonary edema with bilateral pleural effusions, large on the  right and small on the left. Chronic large hiatal hernia. Aortic  atherosclerosis. Hyperlucent lungs consistent with emphysema. No acute bone abnormality. IMPRESSION: Congestive heart failure with interstitial and alveolar pulmonary edema with bilateral pleural effusions. Emphysema. Aortic atherosclerosis. Electronically Signed   By: Lorriane Shire M.D.   On: 09/12/2015 11:57    ASSESSMENT AND PLAN:   Active Problems:   CHF (congestive heart failure) (HCC)  * Acute congestive heart failure with acute hypoxic respiratory failure- unable to determine type of CHF as family refused to get Echocardiogram done. Possible etiologies are long-standing uncontrolled hypertension, valvular disease, ACS. Discussed with family regarding goals of care. Would like patient be treated symptomatically so that she improved with CHF. No echocardiogram or aggressive procedures. Will treat with IV Lasix 40 twice a day. Potassium supplements. Monitor input and output. Daily weights. Repeat electrolytes and BUN and creatinine tomorrow morning. Palliative care involved as per family wishes.  * Uncontrolled hypertension Continue patient's clonidine, lisinopril, hydrochlorothiazide, Norvasc. Monitor with diuresis.Increased amlodipin as BP is high.  * Carcinoid tumor Patient has chronic on and off abdominal pain and chronic diarrhea. Resume loperamide. Check stool for C diff also as she was on Abx recently.  * Early dementia Watch for inpatient delirium  * Leukocytosis No source of infection found. Recently treated for UTI with Escherichia coli. Will repeat urinalysis. Check blood cultures. No antibiotics at this time. Repeat labs in the morning.  * Mild elevation of troponin likely due to CHF. Will not repeat as family does not want any aggressive procedures like stress test or cardiac catheterization.  * DVT prophylaxis with Lovenox.   All the records are reviewed and case discussed with Care Management/Social Workerr. Management plans discussed with the patient,  family and they are in agreement.  CODE STATUS: DNR  TOTAL TIME TAKING CARE OF THIS PATIENT: 35 minutes.  Discussed with her son and daughter in law.   POSSIBLE D/C IN 1-2 DAYS, DEPENDING ON CLINICAL CONDITION.   Vaughan Basta M.D on 09/13/2015   Between 7am to 6pm - Pager - [phone]  After 6pm go to [email or web address] - password [email or web address] [facility]  Office  [phone]  CC: Primary care physician; Viviana Simpler, MD  Note: This dictation was prepared with Dragon dictation along with smaller phrase technology. Any transcriptional errors that result from this process are unintentional.

## 2015-09-13 NOTE — Progress Notes (Signed)
Patient remains alert and oriented, denies pain, family at bedside. VSS , pt sit up in the chair today, no diastress noted

## 2015-09-13 NOTE — Progress Notes (Signed)
PT Cancellation Note  Patient Details Name: Sophia Hill MRN: [id number] DOB: Feb 09, 1922   Cancelled Treatment:    Reason Eval/Treat Not Completed: Other (comment). Entered room and pt soiled the bed. Son currently calling RN and aide to assist in clean up. Asked to re-attempt at another time.   D[place] 09/13/2015, 11:09 AM Greggory Stallion, PT, DPT [phone]

## 2015-09-13 NOTE — Care Management (Signed)
spoke with [name]- patient's daughter in law who worked for [employer] until she retired approx two and a half years ago.  she has spoken with attending and mentions that patient "may go to the hospice home for a few weeks at discharge until we see how she does."  discussed hospice home criteria.  Unsure at present if patient would meet the criteria.  Palliative care consult is pending.  Patient does have terminal carcinoid tumor.  she has caregivers that come in the morning to get her up, bath, dressed, medication reminders and again in the evening to help put her to bed.  Until 2-3 nights ago, patient was ambulating to the dining room.  patient says she has not been able to do walk to the dining room for 6 months.  [name] says patient is getting confused and has lost "another 10 pounds and the last couple of months.  It has been discussed that there will not be any aggressive treatment interventions

## 2015-09-13 NOTE — Care Management (Signed)
Patient presents from [place].  She lives alone but has hired caregiver support.  It is hoped that patient will be able to return to [facility] and be able to stay in that environment as along as possible per her son Linna Hoff.  she does not have home 02 but does have a walker.  Admitted with CHF.  Son's wife is a Marine scientist and CM will meet with patient and family to discuss discharge disposition.  PT consult is pending.  Patient is DNR

## 2015-09-13 NOTE — Clinical Documentation Improvement (Signed)
Internal Medicine  Can the diagnosis of  Acute CHF be further specified by type if known?  Thank you     Type - Systolic, Diastolic, Systolic and Diastolic  Other  Clinically Undetermined   Document any associated diagnoses/conditions Vavular disease   Supporting Information: BNP 2199,  SOB   Chest xray: IMPRESSION: Congestive heart failure with interstitial and alveolar pulmonary edema with bilateral pleural effusions. Emphysema. Aortic atherosclerosis.     Treatment:  IV Lasix  Bid, daily wts, Potassium supplements     Please exercise your independent, professional judgment when responding. A specific answer is not anticipated or expected.   Thank You,  Zion [phone]

## 2015-09-14 DIAGNOSIS — Z66 Do not resuscitate: Secondary | ICD-10-CM

## 2015-09-14 DIAGNOSIS — I509 Heart failure, unspecified: Secondary | ICD-10-CM

## 2015-09-14 DIAGNOSIS — R627 Adult failure to thrive: Secondary | ICD-10-CM

## 2015-09-14 DIAGNOSIS — Z515 Encounter for palliative care: Secondary | ICD-10-CM

## 2015-09-14 MED ORDER — TRAMADOL HCL 50 MG PO TABS
50.0000 mg | ORAL_TABLET | Freq: Two times a day (BID) | ORAL | Status: DC | PRN
  Administered 2015-09-14 – 2015-09-15 (×2): 50 mg via ORAL
  Filled 2015-09-14 (×2): qty 1

## 2015-09-14 NOTE — Consult Note (Signed)
Consultation Note Date: 09/14/2015   Patient Name: Sophia Hill  DOB: 12-04-1921  MRN: [id number]  Age / Sex: 82 y.o., female  PCP: Venia Carbon, MD Referring Physician: Vaughan Basta, MD  Reason for Consultation: Establishing goals of care, Non pain symptom management, Pain control and Psychosocial/spiritual support  HPI/Patient Profile: 82 y.o. female   admitted on 09/12/2015  with a known history of CVA, hypertension, carcinoid tumor presents to the emergency room with complaints of 2 weeks of slowly worsening lower extremity swelling. Patient also developed shortness of breath.  Chest x-ray shows pulmonary edema with elevated BNP.  At baseline patient ambulates with a walker. Has had poor appetite and some chronic diarrhea due to carcinoid tumor. Not on treatment for this. She has mild weakness on the right side chronically due to past stroke.  Family is faced with advanced directive decisions and anticipatory care needs  Clinical Assessment and Goals of Care:  This NP Wadie Lessen reviewed medical records, received report from team, assessed the patient and then meet at the patient's bedside along with her son and DIL/Marie and daughter Butch Penny  to discuss diagnosis prognosis, [place], EOL wishes disposition and options.   A detailed discussion was had today regarding advanced directives.  Concepts specific to code status, artifical feeding and hydration, continued IV antibiotics and rehospitalization was had.  The difference between a aggressive medical intervention path  and a palliative comfort care path for this patient at this time was had.  Values and goals of care important to patient and family were attempted to be elicited.  Concept of Hospice and Palliative Care were discussed  Natural trajectory and expectations at EOL were discussed.  Questions and concerns addressed.  Hard Choices  booklet left for review. Family encouraged to call with questions or concerns.  PMT will continue to support holistically.  MOST form introduced     SUMMARY OF RECOMMENDATIONS    Code Status/Advance Care Planning:  DNR   Symptom Management:     Abdominal pain:    K-Pads     Palliative Prophylaxis:   Aspiration, Bowel Regimen, Frequent Pain Assessment and Oral Care  Additional Recommendations (Limitations, Scope, Preferences):   Focus of care is comfort, once discharged avoid hospitalizations, no artifical feeding or hydration, no further diagnostics, lab draws or transfusions   Implement hospice  Psycho-social/Spiritual:   Additional Recommendations: Education on Hospice, family was disappointed that this patient was not eligible for hospice facility   Prognosis:  Less than 6 months Discharge Planning: Home with hospice      Primary Diagnoses: Present on Admission:  **None**  I have reviewed the medical record, interviewed the patient and family, and examined the patient. The following aspects are pertinent.  Past Medical History  Diagnosis Date  . Hyperlipidemia   . Hypertension   . Carcinoid tumor 2005    [facility]  . Depression   . Late effects of CVA (cerebrovascular accident)   . Osteoarthritis, multiple sites   . Anemia  iron deficiency in the past  . Type II or unspecified type diabetes mellitus without mention of complication, not stated as uncontrolled   . COPD (chronic obstructive pulmonary disease) ([place])   . Stroke ([place])   . Cancer [facility])    Social History   Social History  . Marital Status: Widowed    Spouse Name: N/A  . Number of Children: 2  . Years of Education: N/A   Occupational History  . Retired-- [employer] History Main Topics  . Smoking status: Never Smoker   . Smokeless tobacco: Never Used  . Alcohol Use: No  . Drug Use: No  . Sexual Activity: Not Asked    Other Topics Concern  . None   Social History Narrative   Has living will   Son is health care POA   Requests DNR--done 11/02/11   No tube feeds if cognitively unaware   Family History  Problem Relation Age of Onset  . Stroke Mother   . Hypertension Mother   . Cancer Father   . Heart disease Father   . Diabetes Brother    Scheduled Meds: . amLODipine  5 mg Oral Daily  . aspirin  81 mg Oral Daily  . [START ON 09/15/2015] cloNIDine  0.2 mg Transdermal Weekly  . diphenoxylate-atropine  2 tablet Oral TID  . enoxaparin (LOVENOX) injection  40 mg Subcutaneous Q24H  . furosemide  40 mg Intravenous BID  . lisinopril  20 mg Oral Daily   And  . hydrochlorothiazide  25 mg Oral Daily  . potassium chloride  20 mEq Oral BID  . sodium chloride flush  3 mL Intravenous Q12H   Continuous Infusions:  PRN Meds:.sodium chloride, acetaminophen **OR** acetaminophen, albuterol, diphenoxylate-atropine, hydrALAZINE, ondansetron **OR** ondansetron (ZOFRAN) IV, sodium chloride flush Medications Prior to Admission:  Prior to Admission medications   Medication Sig Start Date End Date Taking? Authorizing Provider  acetaminophen (TYLENOL 8 HOUR ARTHRITIS PAIN) 650 MG CR tablet Take 1,300 mg by mouth every 8 (eight) hours as needed for pain.   Yes Historical Provider, MD  amLODipine (NORVASC) 2.5 MG tablet Take 2.5 mg by mouth daily.   Yes Historical Provider, MD  aspirin 81 MG tablet Take 81 mg by mouth daily.   Yes Historical Provider, MD  cloNIDine (CATAPRES - DOSED IN MG/24 HR) 0.2 mg/24hr patch Place 0.2 mg onto the skin once a week.   Yes Historical Provider, MD  diphenoxylate-atropine (LOMOTIL) 2.5-0.025 MG tablet Take 1 tablet by mouth every 4 (four) hours as needed for diarrhea or loose stools. 08/16/15  Yes Evlyn Kanner, NP  hydrocortisone 2.5 % cream Apply topically 3 (three) times daily as needed. To irritated areas 08/24/15  Yes Venia Carbon, MD  lisinopril-hydrochlorothiazide  (PRINZIDE,ZESTORETIC) 20-25 MG tablet Take 1 tablet by mouth daily.   Yes Historical Provider, MD  Multiple Vitamins-Minerals (CENTRUM SILVER ADULT 50+) TABS Take by mouth daily.   Yes Historical Provider, MD   Allergies  Allergen Reactions  . Penicillin G     Other reaction(s): Unknown  . Augmentin [Amoxicillin-Pot Clavulanate] Rash  . Neosporin [Neomycin-Bacitracin Zn-Polymyx] Rash   Review of Systems  Constitutional: Positive for fatigue.  Gastrointestinal: Positive for abdominal pain.  Neurological: Positive for weakness.    Physical Exam  Constitutional: She is cooperative. She has a sickly appearance. Nasal cannula in place.  Cardiovascular: Normal rate, regular rhythm and normal heart sounds.   Pulmonary/Chest: Effort normal and breath sounds normal.  Neurological:  She is alert.  Skin: Skin is warm and dry.    Vital Signs: BP 182/52 mmHg  Pulse 74  Temp(Src) 98.9 F (37.2 C) (Axillary)  Resp 18  Ht 5\' 6"  (1.676 m)  Wt 45.496 kg (100 lb 4.8 oz)  BMI 16.20 kg/m2  SpO2 98% Pain Assessment: No/denies pain POSS *See Group Information*: S-Acceptable,Sleep, easy to arouse Pain Score: 0-No pain   SpO2: SpO2: 98 % O2 Device:SpO2: 98 % O2 Flow Rate: .O2 Flow Rate (L/min): 3.5 L/min  IO: Intake/output summary:  Intake/Output Summary (Last 24 hours) at 09/14/15 1019 Last data filed at 09/14/15 1002  Gross per 24 hour  Intake    240 ml  Output   1975 ml  Net  -1735 ml    LBM: Last BM Date: 09/13/15 Baseline Weight: Weight: 53 kg (116 lb 13.5 oz) Most recent weight: Weight: 45.496 kg (100 lb 4.8 oz)      Palliative Assessment/Data: 30%   Flowsheet Rows        Most Recent Value   Intake Tab    Referral Department  Hospitalist   Unit at Time of Referral  Cardiac/Telemetry Unit   Palliative Care Primary Diagnosis  Cancer   Date Notified  09/13/15   Palliative Care Type  New Palliative care   Reason for referral  Clarify Goals of Care, Counsel Regarding Hospice     Date of Admission  09/12/15   # of days IP prior to Palliative referral  1   Clinical Assessment    Psychosocial & Spiritual Assessment    Palliative Care Outcomes      Discussed with Dr Manuella Ghazi  Time In: 1245 Time Out: 1400 Time Total: 75 min Greater than 50%  of this time was spent counseling and coordinating care related to the above assessment and plan.  Signed by: Wadie Lessen, NP   Please contact Palliative Medicine Team phone at [phone] for questions and concerns.  For individual provider: See Shea Evans

## 2015-09-14 NOTE — Progress Notes (Signed)
Patient resting in bed at this time, family at bedside, VSS , patient denies any pain, pallitive/hospice care MD at bedside

## 2015-09-14 NOTE — Progress Notes (Signed)
[facility] at [place] NAME: Sophia Hill    MR#:  [id number]  DATE OF BIRTH:  02/15/1922  SUBJECTIVE:  CHIEF COMPLAINT:   Chief Complaint  Patient presents with  . Shortness of Breath  . Respiratory Distress     Worsening leg edema and now shortness of breath for last few weeks. Came with congestive heart failure. Family refused to get the echocardiogram to have further diagnosis and just wanted to treat her for comfort.   Had good diuresis overnight still having shortness of breath and requiring supplemental oxygen.   After receiving imodium yesterday- no more diarrhea now.  REVIEW OF SYSTEMS:  CONSTITUTIONAL: No fever, fatigue or weakness.  EYES: No blurred or double vision.  EARS, NOSE, AND THROAT: No tinnitus or ear pain.  RESPIRATORY: No cough,Positive for shortness of breath, wheezing or hemoptysis.  CARDIOVASCULAR: No chest pain, orthopnea, edema.  GASTROINTESTINAL: No nausea, vomiting, positive for diarrhea, no abdominal pain.  GENITOURINARY: No dysuria, hematuria.  ENDOCRINE: No polyuria, nocturia,  HEMATOLOGY: No anemia, easy bruising or bleeding SKIN: No rash or lesion. MUSCULOSKELETAL: No joint pain or arthritis.   NEUROLOGIC: No tingling, numbness, weakness.  PSYCHIATRY: No anxiety or depression.   ROS  DRUG ALLERGIES:   Allergies  Allergen Reactions  . Penicillin G     Other reaction(s): Unknown  . Augmentin [Amoxicillin-Pot Clavulanate] Rash  . Neosporin [Neomycin-Bacitracin Zn-Polymyx] Rash    VITALS:  Blood pressure 130/57, pulse 86, temperature 98 F (36.7 C), temperature source Oral, resp. rate 18, height 5\' 6"  (1.676 m), weight 45.496 kg (100 lb 4.8 oz), SpO2 93 %.  PHYSICAL EXAMINATION:  GENERAL:  82 y.o.-year-old thin patient lying in the bed with no acute distress.  EYES: Pupils equal, round, reactive to light and accommodation. No scleral icterus. Extraocular muscles intact.  HEENT: Head atraumatic,  normocephalic. Oropharynx and nasopharynx clear.  NECK:  Supple, no jugular venous distention. No thyroid enlargement, no tenderness.  LUNGS: Normal breath sounds bilaterally, no wheezing, some crepitation. No use of accessory muscles of respiration. Using nasal canula oxygen. CARDIOVASCULAR: S1, S2 normal. Present systolic murmurs, no rubs, or gallops.  ABDOMEN: Soft, nontender, nondistended. Bowel sounds present. No organomegaly or mass.  EXTREMITIES: No pedal edema, cyanosis, or clubbing.  NEUROLOGIC: Cranial nerves II through XII are intact. Muscle strength 5/5 in all extremities. Sensation intact. Gait not checked.  PSYCHIATRIC: The patient is alert and oriented x 3.  SKIN: No obvious rash, lesion, or ulcer.   Physical Exam LABORATORY PANEL:   CBC  Recent Labs Lab 09/13/15 1012  WBC 15.6*  HGB 7.7*  HCT 24.0*  PLT 400   ------------------------------------------------------------------------------------------------------------------  Chemistries   Recent Labs Lab 09/12/15 1143 09/13/15 1012  NA 137 138  K 3.7 3.6  CL 101 101  CO2 26 30  GLUCOSE 156* 131*  BUN 17 20  CREATININE 0.73 0.73  CALCIUM 8.9 8.7*  AST 28  --   ALT 25  --   ALKPHOS 406*  --   BILITOT 0.3  --    ------------------------------------------------------------------------------------------------------------------  Cardiac Enzymes  Recent Labs Lab 09/12/15 1143  TROPONINI 0.08*   ------------------------------------------------------------------------------------------------------------------  RADIOLOGY:  No results found.  ASSESSMENT AND PLAN:   Active Problems:   CHF (congestive heart failure) (HCC)  * Acute congestive heart failure with acute hypoxic respiratory failure- unable to determine type of CHF as family refused to get Echocardiogram done. Possible etiologies are long-standing uncontrolled hypertension, valvular disease, ACS. Discussed with  family regarding goals of  care. Would like patient be treated symptomatically so that she improved with CHF. No echocardiogram or aggressive procedures. treat with IV Lasix 40 twice a day. Potassium supplements. Monitor input and output. Daily weights. Palliative care involved as per family wishes.  May likely benefit from hospice service.  * Uncontrolled hypertension Continue patient's clonidine, lisinopril, hydrochlorothiazide, Norvasc. Monitor with diuresis.Increased amlodipin as BP is high.  * Carcinoid tumor Patient has chronic on and off abdominal pain and chronic diarrhea. Resume loperamide. Now no more diarrhea for last 24 hours.  * Early dementia Watch for inpatient delirium  * Leukocytosis No source of infection found. Recently treated for UTI with Escherichia coli. UA negative.  * Mild elevation of troponin likely due to CHF. Will not repeat as family does not want any aggressive procedures like stress test or cardiac catheterization.  * DVT prophylaxis with Lovenox.   All the records are reviewed and case discussed with Care Management/Social Workerr. Management plans discussed with the patient, family and they are in agreement.  CODE STATUS: DNR  TOTAL TIME TAKING CARE OF THIS PATIENT: 80 minutes.  Discussed with her son and daughter in law.   POSSIBLE D/C IN 1-2 DAYS, DEPENDING ON CLINICAL CONDITION.   Vaughan Basta M.D on 09/14/2015   Between 7am to 6pm - Pager - [phone]  After 6pm go to [email or web address] - password [email or web address] [facility]  Office  [phone]  CC: Primary care physician; Viviana Simpler, MD  Note: This dictation was prepared with Dragon dictation along with smaller phrase technology. Any transcriptional errors that result from this process are unintentional.

## 2015-09-14 NOTE — Progress Notes (Signed)
New referral for [facility]) services received from Palliative Medicine NP Wadie Lessen following a Palliative Care consult. Sophia Hill is a 82 year old woman with a known history of CVA,HTN, and carcinoid tumor who came to the [facility] ED on 6/26 for evaluation and treatment of increased lower extremity edema and dyspnea. Chest xray revealed pulmonary edema, she also had an elevated BNP.She has been treated with IV lasix and supplimental oxygen. Family has declined echocardiogram and wishes to focus on patient;s comfort with the support of hospice services in her home.  Family asked specifically for [facility], as patient has received HAC services in the past. Writer spoke via telephone to patient's daughter in law [place] [phone]) address for delivery of DME obtained as: [place], [place]. [place], [place]. DME requested to be delivered tomorrow before 1 pm and includes: Fully electric hospital bed with 1/2 top rails, oxygen concentrator and BSC. Patient information faxed to referral. Writer to follow up with a face to face meeting in the morning, [name] was agreeable to this. Writer will also follow up with Hospital care team in the morning to confirm plan. Patient will need EMS transport at discharge and signed portable DNR in place. Thank you for the opportunity to be involved in the care of this patient and her family. Flo Shanks RN, BSN, [place] [facility] [place], [facility] [phone] c

## 2015-09-14 NOTE — Progress Notes (Signed)
PT Cancellation Note  Patient Details Name: Sophia Hill MRN: [id number] DOB: 20-Jun-1921   Cancelled Treatment:    Reason Eval/Treat Not Completed: Patient declined, no reason specified. Chart reviewed and RN consulted. Upon arrival son and daughter-in-law on phone. Attempted to speak with them before attempting PT treatment. They acknowledge therapist but remain on phone for at least 6-7 minutes. In the interim therapist decided to speak with patient directly who states she would like to perform bed exercises. While returning to room with [place] was stopped by daughter-in-law who reports that pt cannot participate with physical therapy. Advised daughter-in-law that pt states she would like to perform bed exercises. Entered room with daughter-in-law who told pt that she cannot do any exercise because "your heart has been through a lot." Daughter-in-law states she was not happy with the physical therapist yesterday who forced patient to get up to recliner. Pt continues to assert that she can do bed exercises however daughter-in-law tells pt she doesn't need to feel like she "has to do exercises." Daughter-in-law states that she's going home with Hospice and they will provide for all of her needs. Daughter-in-law declines further PT services while admitted at [facility]. Will complete PT order per family request. Family advised to follow up with hospice or PCP if further PT is desired by patient. Family agrees. Will complete PT orders. Please enter new order if status or needs change.   Lyndel Safe Huprich PT, DPT   Huprich,Jason 09/14/2015, 4:24 PM

## 2015-09-14 NOTE — Care Management (Signed)
There is a palliative care consult in progress.  Spoke with attending regarding daughter in [place] discussion of going to hospice home and  that at present time,  patient may not meet criteria for hospice home.  It appears that patient will return to [facility] with round the clock caregivers under hospice care through [facility]  per [name]- daughter in law. It is anticipated patient will require home 02.  Patient son and daughter are currently meeting with palliative care.

## 2015-09-15 ENCOUNTER — Telehealth: Payer: Self-pay | Admitting: Internal Medicine

## 2015-09-15 DIAGNOSIS — Z515 Encounter for palliative care: Secondary | ICD-10-CM | POA: Insufficient documentation

## 2015-09-15 DIAGNOSIS — R627 Adult failure to thrive: Secondary | ICD-10-CM | POA: Insufficient documentation

## 2015-09-15 DIAGNOSIS — Z66 Do not resuscitate: Secondary | ICD-10-CM | POA: Insufficient documentation

## 2015-09-15 MED ORDER — FUROSEMIDE 20 MG PO TABS: 20.0000 mg | ORAL_TABLET | Freq: Every day | ORAL | Status: AC | PRN

## 2015-09-15 MED ORDER — AMLODIPINE BESYLATE 5 MG PO TABS: 5.0000 mg | ORAL_TABLET | Freq: Every day | ORAL | Status: AC

## 2015-09-15 NOTE — Telephone Encounter (Signed)
Debra @ hospice called Pt is discharging from [facility] today  They are recommending hospice @ home Hilda Blades will be faxing order if you agree please sign and fax back

## 2015-09-15 NOTE — Care Management Important Message (Signed)
Important Message  Patient Details  Name: Sophia Hill MRN: [id number] Date of Birth: 01-Feb-1922   Medicare Important Message Given:  Yes    Katrina Stack, RN 09/15/2015, 12:03 PM

## 2015-09-15 NOTE — Progress Notes (Signed)
Discharge paperwork printed. Information along with home medication list gone over with patients family. Printed rx given to patients family. Verbalized understanding. DNR form in packet, will give to ems. Ems called for non emergent transport. Will continue to monitor

## 2015-09-15 NOTE — Telephone Encounter (Signed)
Yes that sounds good. 

## 2015-09-15 NOTE — Consult Note (Signed)
   [facility] CM Inpatient Consult   09/15/2015  SHAKINAH BECVAR 1921/03/22 [id number]   Patient screened for potential [facility] Management services. Patient is eligible for [facility] [place]. Electronic medical record reveals patient's discharge plan is hospice. [facility] Care Management services not appropriate at this time. If patient's post hospital needs change please place a [facility] Care Management consult. For questions please contact:   Tracen Mahler RN, [facility] Liaison  [phone]) Business Mobile [phone]) Toll free office

## 2015-09-15 NOTE — Care Management (Addendum)
Patient for discharge home today under the care of [facility].  She will discharge to the home of her son Linna Hoff to [place] and will transport by EMS.  DME is being delivered to the home: hospital bed, 02, BSC.  Awaiting actual discharge order.  DNR placed in envelop that will be used for travel by ems.  Instructed family that ems will give back to family when arrives home

## 2015-09-15 NOTE — Progress Notes (Addendum)
Called EMS for the second time regardin status of transport, was told by ems patient has one in front of her. Will continue to monitor until ems arrives

## 2015-09-15 NOTE — Discharge Summary (Signed)
[facility] [place] NAME: Sophia Hill    MR#:  [id number]  DATE OF BIRTH:  01/11/1922  DATE OF ADMISSION:  09/12/2015 ADMITTING PHYSICIAN: Hillary Bow, MD  DATE OF DISCHARGE: No discharge date for patient encounter.  PRIMARY CARE PHYSICIAN: Viviana Simpler, MD    ADMISSION DIAGNOSIS:  Acute pulmonary edema (HCC) [J81.0]  DISCHARGE DIAGNOSIS:  Active Problems:   CHF (congestive heart failure) ([place])   DNR (do not resuscitate)   Palliative care encounter   Adult failure to thrive   SECONDARY DIAGNOSIS:   Past Medical History  Diagnosis Date  . Hyperlipidemia   . Hypertension   . Carcinoid tumor 2005    [facility]  . Depression   . Late effects of CVA (cerebrovascular accident)   . Osteoarthritis, multiple sites   . Anemia     iron deficiency in the past  . Type II or unspecified type diabetes mellitus without mention of complication, not stated as uncontrolled   . COPD (chronic obstructive pulmonary disease) ([place])   . Stroke ([place])   . [facility])     HOSPITAL COURSE:   * Acute congestive heart failure with acute hypoxic respiratory failure- unable to determine type of CHF as family refused to get Echocardiogram done. Possible etiologies are long-standing uncontrolled hypertension, valvular disease, ACS. Discussed with family regarding goals of care. Would like patient be treated symptomatically so that she improved with CHF. No echocardiogram or aggressive procedures. treat with IV Lasix 40 twice a day. Potassium supplements. Monitor input and output. Daily weights. Palliative care involved as per family wishes. May likely benefit from hospice service.  * Uncontrolled hypertension Continue patient's clonidine, lisinopril, hydrochlorothiazide, Norvasc. Monitor with diuresis.Increased amlodipin as BP is high.  * Carcinoid tumor Patient has chronic on and off abdominal pain  and chronic diarrhea. Resume loperamide. Now no more diarrhea for last 24 hours.  * Early dementia Watch for inpatient delirium  * Leukocytosis No source of infection found. Recently treated for UTI with Escherichia coli. UA negative.  * Mild elevation of troponin likely due to CHF. Will not repeat as family does not want any aggressive procedures like stress test or cardiac catheterization.  * DVT prophylaxis with Lovenox.  DISCHARGE CONDITIONS:   Stable.  CONSULTS OBTAINED:     DRUG ALLERGIES:   Allergies  Allergen Reactions  . Penicillin G     Other reaction(s): Unknown  . Augmentin [Amoxicillin-Pot Clavulanate] Rash  . Neosporin [Neomycin-Bacitracin Zn-Polymyx] Rash    DISCHARGE MEDICATIONS:   Current Discharge Medication List    CONTINUE these medications which have CHANGED   Details  amLODipine (NORVASC) 5 MG tablet Take 1 tablet (5 mg total) by mouth daily. Qty: 30 tablet, Refills: 0      CONTINUE these medications which have NOT CHANGED   Details  acetaminophen (TYLENOL 8 HOUR ARTHRITIS PAIN) 650 MG CR tablet Take 1,300 mg by mouth every 8 (eight) hours as needed for pain.    aspirin 81 MG tablet Take 81 mg by mouth daily.    cloNIDine (CATAPRES - DOSED IN MG/24 HR) 0.2 mg/24hr patch Place 0.2 mg onto the skin once a week.    diphenoxylate-atropine (LOMOTIL) 2.5-0.025 MG tablet Take 1 tablet by mouth every 4 (four) hours as needed for diarrhea or loose stools. Qty: 180 tablet, Refills: 3   Associated Diagnoses: Neuroendocrine carcinoma of colon (HCC)    hydrocortisone 2.5 % cream Apply topically  3 (three) times daily as needed. To irritated areas Qty: 60 g, Refills: 2    lisinopril-hydrochlorothiazide (PRINZIDE,ZESTORETIC) 20-25 MG tablet Take 1 tablet by mouth daily.    Multiple Vitamins-Minerals (CENTRUM SILVER ADULT 50+) TABS Take by mouth daily.         DISCHARGE INSTRUCTIONS:    Follow with PMD as needed, Discharging with Hospice at  home.  If you experience worsening of your admission symptoms, develop shortness of breath, life threatening emergency, suicidal or homicidal thoughts you must seek medical attention immediately by calling 911 or calling your MD immediately  if symptoms less severe.  You Must read complete instructions/literature along with all the possible adverse reactions/side effects for all the Medicines you take and that have been prescribed to you. Take any new Medicines after you have completely understood and accept all the possible adverse reactions/side effects.   Please note  You were cared for by a hospitalist during your hospital stay. If you have any questions about your discharge medications or the care you received while you were in the hospital after you are discharged, you can call the unit and asked to speak with the hospitalist on call if the hospitalist that took care of you is not available. Once you are discharged, your primary care physician will handle any further medical issues. Please note that NO REFILLS for any discharge medications will be authorized once you are discharged, as it is imperative that you return to your primary care physician (or establish a relationship with a primary care physician if you do not have one) for your aftercare needs so that they can reassess your need for medications and monitor your lab values.    Today   CHIEF COMPLAINT:   Chief Complaint  Patient presents with  . Shortness of Breath  . Respiratory Distress    HISTORY OF PRESENT ILLNESS:  Sophia Hill  is a 82 y.o. female with a known history of CVA, hypertension, carcinoid tumor presents to the emergency room with complaints of 2 weeks of slowly worsening lower extremity swelling. Patient also developed shortness of breath. She is needing 3 L oxygen at this time in the emergency room to keep her saturations greater than 90%. Chest x-ray shows pulmonary edema with elevated BNP. Most of the  history obtained from family and reviewing old records. Patient is able to give history but very minimal. She was seen recently by her PCP and an echocardiogram was refused by family as patient is 82 years old and they are interested in hospice at discharge. Family would like symptomatic treatment. At baseline patient ambulates with a walker. Has had poor appetite and some chronic diarrhea due to carcinoid tumor. Not on treatment for this. She has mild weakness on the right side chronically due to past stroke.  She is DO NOT RESUSCITATE.  Was on hospice in the past but her appetite improved and patient did well and was discharged from hospice.   VITAL SIGNS:  Blood pressure 156/43, pulse 78, temperature 98.3 F (36.8 C), temperature source Oral, resp. rate 16, height 5\' 6"  (1.676 m), weight 43.137 kg (95 lb 1.6 oz), SpO2 96 %.  I/O:   Intake/Output Summary (Last 24 hours) at 09/15/15 1445 Last data filed at 09/15/15 1341  Gross per 24 hour  Intake    600 ml  Output    850 ml  Net   -250 ml    PHYSICAL EXAMINATION:   GENERAL: 82 y.o.-year-old thin patient lying in the  bed with no acute distress.  EYES: Pupils equal, round, reactive to light and accommodation. No scleral icterus. Extraocular muscles intact.  HEENT: Head atraumatic, normocephalic. Oropharynx and nasopharynx clear.  NECK: Supple, no jugular venous distention. No thyroid enlargement, no tenderness.  LUNGS: Normal breath sounds bilaterally, no wheezing, some crepitation. No use of accessory muscles of respiration. Using nasal canula oxygen. CARDIOVASCULAR: S1, S2 normal. Present systolic murmurs, no rubs, or gallops.  ABDOMEN: Soft, nontender, nondistended. Bowel sounds present. No organomegaly or mass.  EXTREMITIES: No pedal edema, cyanosis, or clubbing.  NEUROLOGIC: Cranial nerves II through XII are intact. Muscle strength 5/5 in all extremities. Sensation intact. Gait not checked.  PSYCHIATRIC: The patient is  alert and oriented x 3.  SKIN: No obvious rash, lesion, or ulcer.   DATA REVIEW:   CBC  Recent Labs Lab 09/13/15 1012  WBC 15.6*  HGB 7.7*  HCT 24.0*  PLT 400    Chemistries   Recent Labs Lab 09/12/15 1143 09/13/15 1012  NA 137 138  K 3.7 3.6  CL 101 101  CO2 26 30  GLUCOSE 156* 131*  BUN 17 20  CREATININE 0.73 0.73  CALCIUM 8.9 8.7*  AST 28  --   ALT 25  --   ALKPHOS 406*  --   BILITOT 0.3  --     Cardiac Enzymes  Recent Labs Lab 09/12/15 1143  TROPONINI 0.08*    Microbiology Results  Results for orders placed or performed in visit on 08/31/15  Urine culture     Status: None   Collection Time: 08/31/15  4:50 PM  Result Value Ref Range Status   Culture ESCHERICHIA COLI  Final   Colony Count >=100,000 COLONIES/ML  Final   Organism ID, Bacteria ESCHERICHIA COLI  Final      Susceptibility   Escherichia coli -  (no method available)    AMPICILLIN <=2 Sensitive     AMOX/CLAVULANIC <=2 Sensitive     AMPICILLIN/SULBACTAM <=2 Sensitive     PIP/TAZO <=4 Sensitive     IMIPENEM <=0.25 Sensitive     CEFAZOLIN <=4 Not Reportable     CEFTRIAXONE <=1 Sensitive     CEFTAZIDIME <=1 Sensitive     CEFEPIME <=1 Sensitive     GENTAMICIN <=1 Sensitive     TOBRAMYCIN <=1 Sensitive     CIPROFLOXACIN <=0.25 Sensitive     LEVOFLOXACIN <=0.12 Sensitive     NITROFURANTOIN <=16 Sensitive     TRIMETH/SULFA* <=20 Sensitive      * NR=NOT REPORTABLE,SEE COMMENTORAL therapy:A cefazolin MIC of <32 predicts susceptibility to the oral agents cefaclor,cefdinir,cefpodoxime,cefprozil,cefuroxime,cephalexin,and loracarbef when used for therapy of uncomplicated UTIs due to E.coli,K.pneumomiae,and P.mirabilis. PARENTERAL therapy: A cefazolinMIC of >8 indicates resistance to parenteralcefazolin. An alternate test method must beperformed to confirm susceptibility to parenteralcefazolin.    RADIOLOGY:  No results found.  EKG:   Orders placed or performed during the hospital encounter  of 09/12/15  . ED EKG  . ED EKG      Management plans discussed with the patient, family and they are in agreement.  CODE STATUS:     Code Status Orders        Start     Ordered   09/12/15 1341  Do not attempt resuscitation (DNR)   Continuous    Question Answer Comment  In the event of cardiac or respiratory ARREST Do not call a "code blue"   In the event of cardiac or respiratory ARREST Do not perform Intubation, CPR, defibrillation or  ACLS   In the event of cardiac or respiratory ARREST Use medication by any route, position, wound care, and other measures to relive pain and suffering. May use oxygen, suction and manual treatment of airway obstruction as needed for comfort.      09/12/15 1342    Code Status History    Date Active Date Inactive Code Status Order ID Comments User Context   This patient has a current code status but no historical code status.    Advance Directive Documentation        Most Recent Value   Type of Advance Directive  Out of facility DNR (pink MOST or yellow form), Healthcare Power of Attorney, Living will   Pre-existing out of facility DNR order (yellow form or pink MOST form)  Yellow form placed in chart (order not valid for inpatient use)   "MOST" Form in Place?        TOTAL TIME TAKING CARE OF THIS PATIENT: 35 minutes.    Vaughan Basta M.D on 09/15/2015 at 2:45 PM  Between 7am to 6pm - Pager - [phone]  After 6pm go to [email or web address] - password [email or web address] [facility]  Office  [phone]  CC: Primary care physician; Viviana Simpler, MD   Note: This dictation was prepared with Dragon dictation along with smaller phrase technology. Any transcriptional errors that result from this process are unintentional.

## 2015-09-15 NOTE — Progress Notes (Signed)
Visit made to new referral for [facility] services at [facility] following discharge. Writer had spoken via telephone to Mrs. Indelicato's daughter in law [name] yesterday regarding services and DME needs. Patient seen lying in bed, alert and oriented, she remains on oxygen at 2 liters via nasal cannula, she appears very weak. Son Linna Hoff and daughter Butch Penny present in the room. Dan IT trainer that the plan has changed in that now Mrs. Butterly will be going to his house NOT [facility]. Linna Hoff has spoken to the DME equipment company and they are aware of the new address. Hospice triage and referral intake made aware as well. Plan continues for discharge today via EMS with portable DNR in place. CMRN Joni Reining made aware of address change for EMTALA form. Thank you. Flo Shanks RN, BSN, [place] [facility] [place], [facility] [phone] c

## 2015-09-15 NOTE — Progress Notes (Signed)
SATURATION QUALIFICATIONS: (This note is used to comply with regulatory documentation for home oxygen)  Patient Saturations on Room Air at Rest = 88%  Patient Saturations on Room Air while Ambulating =na%  Patient Saturations on 1 Liters of oxygen at rest = 97%  Please briefly explain why patient needs home oxygen:

## 2015-09-15 NOTE — Progress Notes (Signed)
Ems on the floor for transport, packet given to ems. Patient will be discharged on 02.

## 2015-09-16 ENCOUNTER — Telehealth: Payer: Self-pay | Admitting: Internal Medicine

## 2015-09-16 MED ORDER — ONDANSETRON HCL 4 MG PO TABS: 4.0000 mg | ORAL_TABLET | Freq: Three times a day (TID) | ORAL | Status: AC | PRN

## 2015-09-16 MED ORDER — MORPHINE SULFATE (CONCENTRATE) 20 MG/ML PO SOLN: 5.0000 mg | ORAL | Status: AC | PRN

## 2015-09-16 NOTE — Telephone Encounter (Signed)
Spoke to [place]. She said there is no way she is going back to [facility]. Will need a home visit. Hospice is coming out today for evaluation. She may need something for nausea to take with the Roxanol. Call that in to [facility].

## 2015-09-16 NOTE — Telephone Encounter (Signed)
Marie called.  Patient came home last night. Patient is having abdominal pain. [name] would like to know what to do for pain. [facility] didn't send any pain medication home with patient. [name] gave patient extra strength tylenol and it didn't touch the pain.  The pain has increased since last night.  Patient doesn't eat. She drinks very little.  She urinated a small amount one time and she goes from diarrhea to her bowels not moving.  Hospice is coming between 3:00 and 4:00. [name] is asking for Dr.Letvak to call her back when he finishes with patients this morning.

## 2015-09-16 NOTE — Telephone Encounter (Signed)
Let her know I wrote a prescription for roxanol--they will have to come and pick it up. I spoke to [place] husband yesterday---and will plan home visits if she isn't improving (and able to go back to [facility]). They just have to let me know

## 2015-09-16 NOTE — Telephone Encounter (Signed)
Let her know that I sent a prescription for zofran. It will probably be at least a couple of weeks before I can make it out there

## 2015-09-16 NOTE — Telephone Encounter (Signed)
Spoke to Sophia Hill. She will keep [place] updated. She does not think she will make it very long. She may end up at the [facility] soon. Said to send Dr Silvio Pate and big thank you hug for everything he has done!

## 2015-09-18 ENCOUNTER — Telehealth: Payer: Self-pay | Admitting: Family Medicine

## 2015-09-19 ENCOUNTER — Telehealth: Payer: Self-pay | Admitting: Internal Medicine

## 2015-09-19 ENCOUNTER — Telehealth: Payer: Self-pay | Admitting: *Deleted

## 2015-09-19 NOTE — Telephone Encounter (Signed)
[name] called to let you know her mom Sophia Hill passed away July 06, 2022  She wanted thank you for all the care you gave her mom [name] Wednesday in [place] She will be cremated

## 2015-09-19 NOTE — Telephone Encounter (Signed)
expected

## 2015-09-19 NOTE — Telephone Encounter (Signed)
I called and passed on my condolences

## 2015-09-19 NOTE — Telephone Encounter (Signed)
Called to report that Sophia Hill passed away at home on September 25, 2015 after being discharged form hospital for CHF and wanted to thank Dr Oliva Bustard and staff for all the care we gave her. She asked that we cancel her appt for August with Dr Grayland Ormond.

## 2015-09-19 NOTE — Telephone Encounter (Signed)
PLEASE NOTE: All timestamps contained within this report are represented as [place] Standard Time. CONFIDENTIALTY NOTICE: This fax transmission is intended only for the addressee. It contains information that is legally privileged, confidential or otherwise protected from use or disclosure. If you are not the intended recipient, you are strictly prohibited from reviewing, disclosing, copying using or disseminating any of this information or taking any action in reliance on or regarding this information. If you have received this fax in error, please notify us immediately by telephone so that we can arrange for its return to [place]. Phone: [phone], Toll-Free: [phone], Fax: [phone] Page: 1 of 1 Call Id: [id number] [place] Night - Client Nonclinical Telephone Record [place] Night - Client Client Site [place] Physician Viviana Simpler - MD Contact Type Call Call [place] Page Now Who Is [place] / [place] Name [place] Name [place] Number [phone] Patient Name Sophia Hill Patient DOB 1921-06-19 Reason for Call Death Notification Initial Comment Caller states she is calling to give dr notification of a death of one of his pts. Additional Comment She is only notifying the dr, doesn't need to speak to him. Paging DoctorName Phone DateTime Result/Outcome Message Type Notes Howard Pouch [id number] Oct 11, 2015 4:07:20 PM Called On Call Provider - Reached Doctor Paged Howard Pouch 2015-10-11 4:07:32 PM Spoke with On Call - General Message Result Call Closed By: Honor Loh Transaction Date/Time: October 11, 2015 3:59:49 PM (ET)

## 2015-09-22 ENCOUNTER — Telehealth: Payer: Self-pay | Admitting: Internal Medicine

## 2015-09-22 NOTE — Telephone Encounter (Signed)
Opened in error

## 2015-10-18 ENCOUNTER — Ambulatory Visit: Payer: Medicare Other | Admitting: Oncology

## 2015-10-18 ENCOUNTER — Other Ambulatory Visit: Payer: Medicare Other

## 2015-10-18 NOTE — Telephone Encounter (Signed)
I received a page today from [facility] with notification of  Larren Yeske passing on 2015/09/25.  Electronically Signed by: Howard Pouch, DO [place] [facility] [place]

## 2015-10-18 DEATH — deceased

## 2015-12-13 ENCOUNTER — Ambulatory Visit: Payer: Medicare Other | Admitting: Internal Medicine

## 2016-02-25 ENCOUNTER — Other Ambulatory Visit: Payer: Self-pay | Admitting: Nurse Practitioner

## 2016-09-07 IMAGING — DX DG CHEST 1V PORT
1 series · 1 of 1 positions shown · non-contrast
Comparison: 05/09/2013

CLINICAL DATA: Progressive shortness of breath since yesterday.
Hypertension. COPD.

EXAM:
PORTABLE CHEST 1 VIEW

[chest ap]
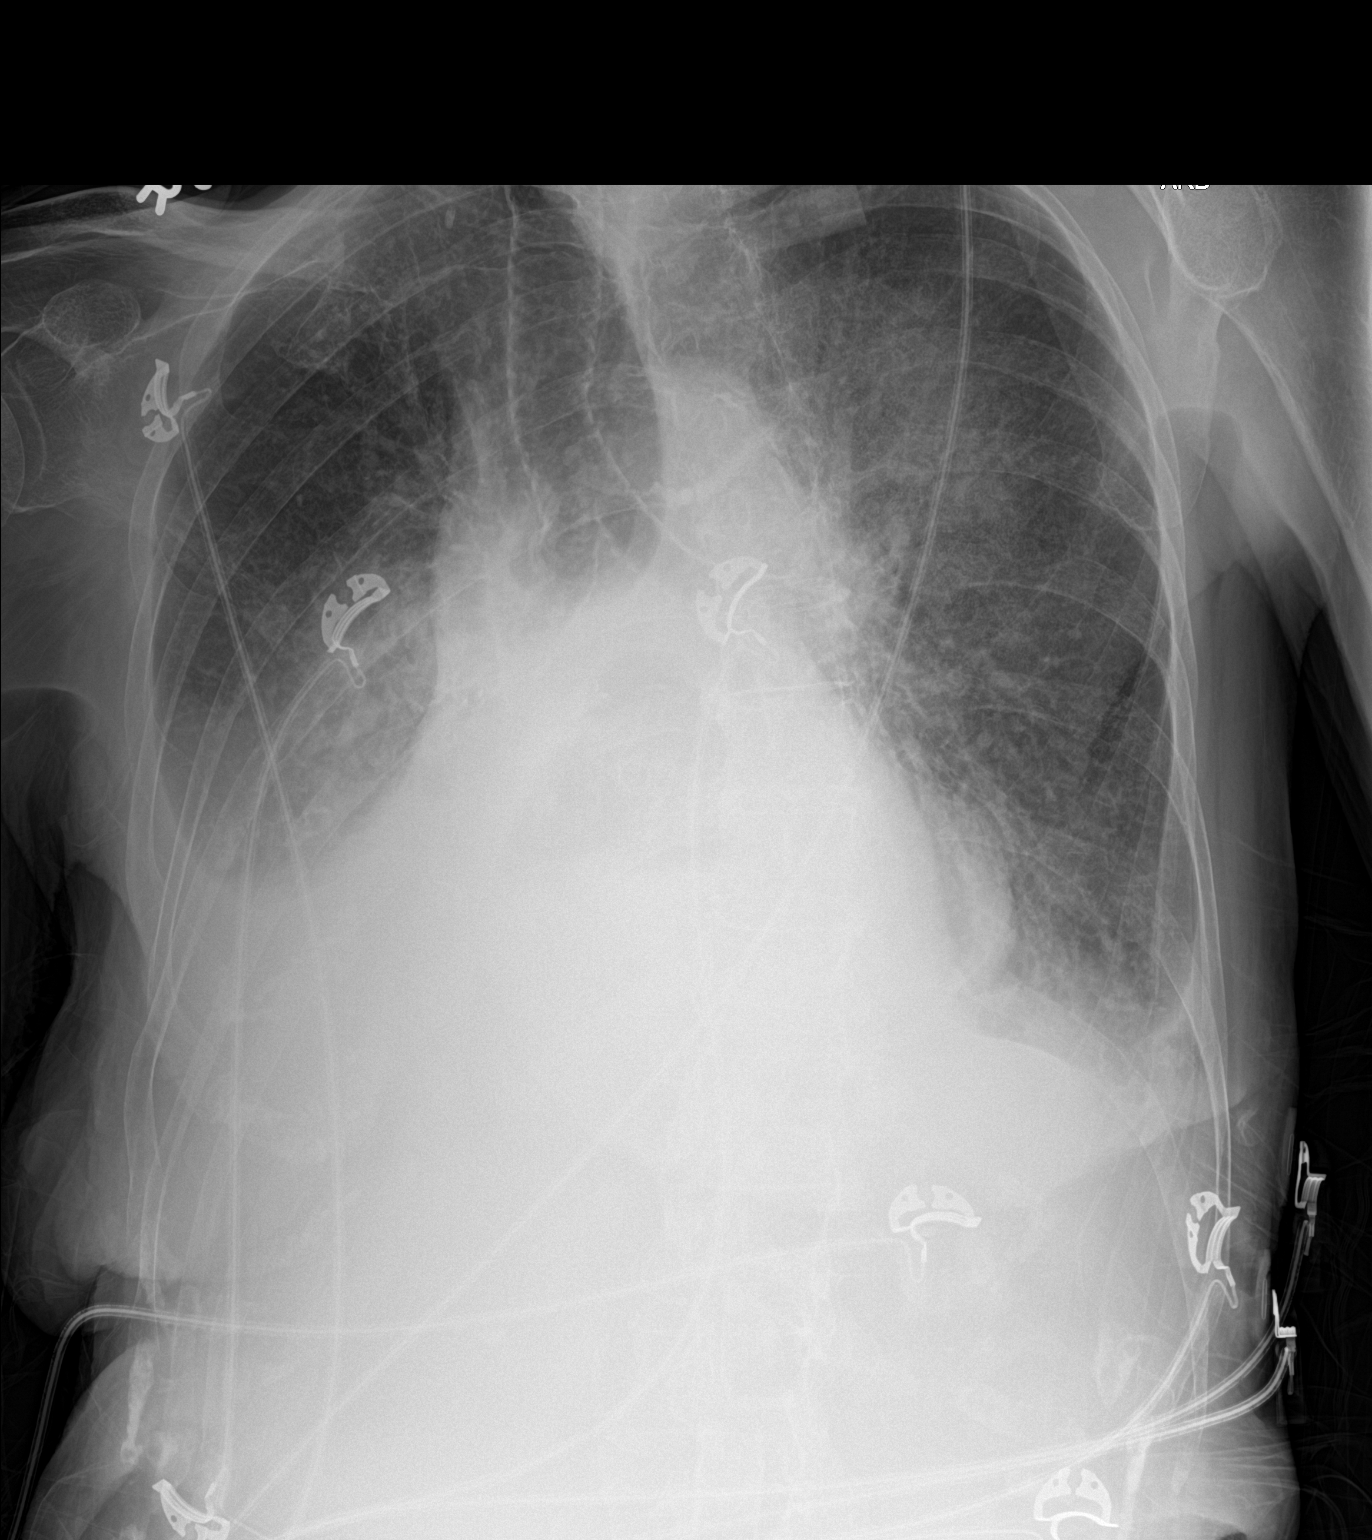

[1 of 1 positions shown; findings below may reference images not displayed]

FINDINGS: The patient has bilateral pulmonary edema with bilateral pleural
effusions, large on the right and small on the left. Chronic large
hiatal hernia. Aortic atherosclerosis.

Hyperlucent lungs consistent with emphysema. No acute bone
abnormality.
IMPRESSION: Congestive heart failure with interstitial and alveolar pulmonary
edema with bilateral pleural effusions.

Emphysema.

Aortic atherosclerosis.
